# Patient Record
Sex: Female | Born: 1955 | Race: White | Hispanic: No | Smoking: Former smoker
Health system: Southern US, Community
[De-identification: ages and names within clinical notes are randomized; demographics above are authoritative.]

## PROBLEM LIST (undated history)

## (undated) DIAGNOSIS — H269 Unspecified cataract: Secondary | ICD-10-CM

## (undated) DIAGNOSIS — H409 Unspecified glaucoma: Secondary | ICD-10-CM

## (undated) DIAGNOSIS — G4730 Sleep apnea, unspecified: Secondary | ICD-10-CM

## (undated) DIAGNOSIS — J449 Chronic obstructive pulmonary disease, unspecified: Secondary | ICD-10-CM

## (undated) DIAGNOSIS — J45909 Unspecified asthma, uncomplicated: Secondary | ICD-10-CM

## (undated) DIAGNOSIS — F4310 Post-traumatic stress disorder, unspecified: Secondary | ICD-10-CM

## (undated) DIAGNOSIS — K3184 Gastroparesis: Secondary | ICD-10-CM

## (undated) DIAGNOSIS — F329 Major depressive disorder, single episode, unspecified: Secondary | ICD-10-CM

## (undated) DIAGNOSIS — F32A Depression, unspecified: Secondary | ICD-10-CM

## (undated) DIAGNOSIS — F319 Bipolar disorder, unspecified: Secondary | ICD-10-CM

## (undated) DIAGNOSIS — I700 Atherosclerosis of aorta: Secondary | ICD-10-CM

## (undated) DIAGNOSIS — E559 Vitamin D deficiency, unspecified: Secondary | ICD-10-CM

## (undated) DIAGNOSIS — E785 Hyperlipidemia, unspecified: Secondary | ICD-10-CM

## (undated) DIAGNOSIS — M810 Age-related osteoporosis without current pathological fracture: Secondary | ICD-10-CM

## (undated) DIAGNOSIS — K581 Irritable bowel syndrome with constipation: Secondary | ICD-10-CM

## (undated) DIAGNOSIS — F419 Anxiety disorder, unspecified: Secondary | ICD-10-CM

## (undated) DIAGNOSIS — D649 Anemia, unspecified: Secondary | ICD-10-CM

## (undated) DIAGNOSIS — J439 Emphysema, unspecified: Secondary | ICD-10-CM

## (undated) DIAGNOSIS — K2970 Gastritis, unspecified, without bleeding: Secondary | ICD-10-CM

## (undated) HISTORY — DX: Gastroparesis: K31.84

## (undated) HISTORY — DX: Chronic obstructive pulmonary disease, unspecified: J44.9

## (undated) HISTORY — DX: Sleep apnea, unspecified: G47.30

## (undated) HISTORY — DX: Unspecified asthma, uncomplicated: J45.909

## (undated) HISTORY — DX: Anxiety disorder, unspecified: F41.9

## (undated) HISTORY — DX: Unspecified cataract: H26.9

## (undated) HISTORY — DX: Bipolar disorder, unspecified: F31.9

## (undated) HISTORY — DX: Irritable bowel syndrome with constipation: K58.1

## (undated) HISTORY — DX: Gastritis, unspecified, without bleeding: K29.70

## (undated) HISTORY — DX: Unspecified glaucoma: H40.9

## (undated) HISTORY — PX: EXCISION OF ABDOMINAL WALL TUMOR: SHX6687

## (undated) HISTORY — DX: Emphysema, unspecified: J43.9

## (undated) HISTORY — DX: Hyperlipidemia, unspecified: E78.5

## (undated) HISTORY — DX: Vitamin D deficiency, unspecified: E55.9

## (undated) HISTORY — DX: Post-traumatic stress disorder, unspecified: F43.10

## (undated) HISTORY — DX: Atherosclerosis of aorta: I70.0

## (undated) HISTORY — DX: Age-related osteoporosis without current pathological fracture: M81.0

## (undated) HISTORY — DX: Depression, unspecified: F32.A

## (undated) HISTORY — DX: Anemia, unspecified: D64.9

---
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
## 2007-06-08 ENCOUNTER — Encounter: Admission: RE | Admit: 2007-06-08 | Discharge: 2007-06-08 | Payer: Self-pay | Admitting: Obstetrics & Gynecology

## 2010-08-27 DIAGNOSIS — R519 Headache, unspecified: Secondary | ICD-10-CM | POA: Insufficient documentation

## 2011-11-09 DIAGNOSIS — M79609 Pain in unspecified limb: Secondary | ICD-10-CM | POA: Insufficient documentation

## 2012-02-25 DIAGNOSIS — J208 Acute bronchitis due to other specified organisms: Secondary | ICD-10-CM | POA: Insufficient documentation

## 2012-08-31 DIAGNOSIS — R3915 Urgency of urination: Secondary | ICD-10-CM | POA: Insufficient documentation

## 2012-12-05 DIAGNOSIS — R002 Palpitations: Secondary | ICD-10-CM | POA: Insufficient documentation

## 2012-12-28 DIAGNOSIS — J029 Acute pharyngitis, unspecified: Secondary | ICD-10-CM | POA: Insufficient documentation

## 2014-07-02 DIAGNOSIS — R42 Dizziness and giddiness: Secondary | ICD-10-CM | POA: Insufficient documentation

## 2014-10-14 DIAGNOSIS — J0191 Acute recurrent sinusitis, unspecified: Secondary | ICD-10-CM | POA: Insufficient documentation

## 2015-04-04 DIAGNOSIS — L299 Pruritus, unspecified: Secondary | ICD-10-CM | POA: Insufficient documentation

## 2015-04-04 DIAGNOSIS — J4520 Mild intermittent asthma, uncomplicated: Secondary | ICD-10-CM | POA: Insufficient documentation

## 2015-04-08 DIAGNOSIS — R21 Rash and other nonspecific skin eruption: Secondary | ICD-10-CM | POA: Insufficient documentation

## 2015-07-24 ENCOUNTER — Ambulatory Visit (INDEPENDENT_AMBULATORY_CARE_PROVIDER_SITE_OTHER): Payer: Medicare Other

## 2015-07-24 DIAGNOSIS — J309 Allergic rhinitis, unspecified: Secondary | ICD-10-CM

## 2015-08-04 ENCOUNTER — Ambulatory Visit (INDEPENDENT_AMBULATORY_CARE_PROVIDER_SITE_OTHER): Payer: Medicare Other | Admitting: *Deleted

## 2015-08-04 DIAGNOSIS — J309 Allergic rhinitis, unspecified: Secondary | ICD-10-CM

## 2015-08-21 ENCOUNTER — Ambulatory Visit (INDEPENDENT_AMBULATORY_CARE_PROVIDER_SITE_OTHER): Payer: Medicare Other

## 2015-08-21 DIAGNOSIS — J309 Allergic rhinitis, unspecified: Secondary | ICD-10-CM

## 2015-08-27 DIAGNOSIS — J301 Allergic rhinitis due to pollen: Secondary | ICD-10-CM | POA: Diagnosis not present

## 2015-08-28 DIAGNOSIS — J3089 Other allergic rhinitis: Secondary | ICD-10-CM | POA: Diagnosis not present

## 2015-09-01 ENCOUNTER — Ambulatory Visit (INDEPENDENT_AMBULATORY_CARE_PROVIDER_SITE_OTHER): Payer: Medicare Other | Admitting: *Deleted

## 2015-09-01 DIAGNOSIS — J309 Allergic rhinitis, unspecified: Secondary | ICD-10-CM | POA: Diagnosis not present

## 2015-09-09 DIAGNOSIS — J4530 Mild persistent asthma, uncomplicated: Secondary | ICD-10-CM | POA: Insufficient documentation

## 2015-09-09 DIAGNOSIS — H9203 Otalgia, bilateral: Secondary | ICD-10-CM | POA: Insufficient documentation

## 2015-09-15 ENCOUNTER — Ambulatory Visit (INDEPENDENT_AMBULATORY_CARE_PROVIDER_SITE_OTHER): Payer: Medicare Other | Admitting: *Deleted

## 2015-09-15 DIAGNOSIS — J309 Allergic rhinitis, unspecified: Secondary | ICD-10-CM

## 2015-10-23 ENCOUNTER — Ambulatory Visit (INDEPENDENT_AMBULATORY_CARE_PROVIDER_SITE_OTHER): Payer: Medicare Other | Admitting: *Deleted

## 2015-10-23 DIAGNOSIS — J309 Allergic rhinitis, unspecified: Secondary | ICD-10-CM

## 2015-11-03 ENCOUNTER — Ambulatory Visit (INDEPENDENT_AMBULATORY_CARE_PROVIDER_SITE_OTHER): Payer: Medicare Other | Admitting: *Deleted

## 2015-11-03 DIAGNOSIS — J309 Allergic rhinitis, unspecified: Secondary | ICD-10-CM

## 2015-11-17 ENCOUNTER — Ambulatory Visit (INDEPENDENT_AMBULATORY_CARE_PROVIDER_SITE_OTHER): Payer: Medicare Other | Admitting: *Deleted

## 2015-11-17 DIAGNOSIS — J309 Allergic rhinitis, unspecified: Secondary | ICD-10-CM | POA: Diagnosis not present

## 2015-11-27 DIAGNOSIS — F329 Major depressive disorder, single episode, unspecified: Secondary | ICD-10-CM | POA: Insufficient documentation

## 2015-12-01 ENCOUNTER — Ambulatory Visit (INDEPENDENT_AMBULATORY_CARE_PROVIDER_SITE_OTHER): Payer: Medicare Other

## 2015-12-01 DIAGNOSIS — J309 Allergic rhinitis, unspecified: Secondary | ICD-10-CM

## 2015-12-15 ENCOUNTER — Ambulatory Visit (INDEPENDENT_AMBULATORY_CARE_PROVIDER_SITE_OTHER): Payer: Medicare Other | Admitting: *Deleted

## 2015-12-15 DIAGNOSIS — J309 Allergic rhinitis, unspecified: Secondary | ICD-10-CM | POA: Diagnosis not present

## 2015-12-29 ENCOUNTER — Ambulatory Visit (INDEPENDENT_AMBULATORY_CARE_PROVIDER_SITE_OTHER): Payer: Medicare Other | Admitting: *Deleted

## 2015-12-29 DIAGNOSIS — J309 Allergic rhinitis, unspecified: Secondary | ICD-10-CM

## 2016-01-15 ENCOUNTER — Ambulatory Visit (INDEPENDENT_AMBULATORY_CARE_PROVIDER_SITE_OTHER): Payer: Medicare Other | Admitting: *Deleted

## 2016-01-15 DIAGNOSIS — J309 Allergic rhinitis, unspecified: Secondary | ICD-10-CM

## 2016-02-09 ENCOUNTER — Ambulatory Visit (INDEPENDENT_AMBULATORY_CARE_PROVIDER_SITE_OTHER): Payer: Medicare Other

## 2016-02-09 DIAGNOSIS — J309 Allergic rhinitis, unspecified: Secondary | ICD-10-CM | POA: Diagnosis not present

## 2016-03-01 ENCOUNTER — Ambulatory Visit (INDEPENDENT_AMBULATORY_CARE_PROVIDER_SITE_OTHER): Payer: Medicare Other

## 2016-03-01 DIAGNOSIS — J309 Allergic rhinitis, unspecified: Secondary | ICD-10-CM | POA: Diagnosis not present

## 2016-03-22 ENCOUNTER — Ambulatory Visit (INDEPENDENT_AMBULATORY_CARE_PROVIDER_SITE_OTHER): Payer: Medicare Other | Admitting: *Deleted

## 2016-03-22 DIAGNOSIS — J309 Allergic rhinitis, unspecified: Secondary | ICD-10-CM

## 2016-03-23 DIAGNOSIS — J301 Allergic rhinitis due to pollen: Secondary | ICD-10-CM | POA: Diagnosis not present

## 2016-03-24 DIAGNOSIS — J3089 Other allergic rhinitis: Secondary | ICD-10-CM | POA: Diagnosis not present

## 2016-04-15 ENCOUNTER — Ambulatory Visit (INDEPENDENT_AMBULATORY_CARE_PROVIDER_SITE_OTHER): Payer: Medicare Other | Admitting: *Deleted

## 2016-04-15 DIAGNOSIS — J309 Allergic rhinitis, unspecified: Secondary | ICD-10-CM | POA: Diagnosis not present

## 2016-05-03 ENCOUNTER — Ambulatory Visit (INDEPENDENT_AMBULATORY_CARE_PROVIDER_SITE_OTHER): Payer: Medicare Other

## 2016-05-03 DIAGNOSIS — J309 Allergic rhinitis, unspecified: Secondary | ICD-10-CM

## 2016-05-24 ENCOUNTER — Ambulatory Visit (INDEPENDENT_AMBULATORY_CARE_PROVIDER_SITE_OTHER): Payer: Medicare Other | Admitting: *Deleted

## 2016-05-24 DIAGNOSIS — J309 Allergic rhinitis, unspecified: Secondary | ICD-10-CM | POA: Diagnosis not present

## 2016-06-17 ENCOUNTER — Ambulatory Visit (INDEPENDENT_AMBULATORY_CARE_PROVIDER_SITE_OTHER): Payer: Medicare Other | Admitting: *Deleted

## 2016-06-17 DIAGNOSIS — J309 Allergic rhinitis, unspecified: Secondary | ICD-10-CM

## 2016-07-05 ENCOUNTER — Ambulatory Visit (INDEPENDENT_AMBULATORY_CARE_PROVIDER_SITE_OTHER): Payer: Medicare Other | Admitting: *Deleted

## 2016-07-05 DIAGNOSIS — J309 Allergic rhinitis, unspecified: Secondary | ICD-10-CM | POA: Diagnosis not present

## 2016-07-22 DIAGNOSIS — R55 Syncope and collapse: Secondary | ICD-10-CM | POA: Insufficient documentation

## 2016-08-02 ENCOUNTER — Ambulatory Visit (INDEPENDENT_AMBULATORY_CARE_PROVIDER_SITE_OTHER): Payer: Medicare Other | Admitting: *Deleted

## 2016-08-02 DIAGNOSIS — J309 Allergic rhinitis, unspecified: Secondary | ICD-10-CM

## 2016-08-16 ENCOUNTER — Ambulatory Visit (INDEPENDENT_AMBULATORY_CARE_PROVIDER_SITE_OTHER): Payer: Medicare Other

## 2016-08-16 DIAGNOSIS — J309 Allergic rhinitis, unspecified: Secondary | ICD-10-CM | POA: Diagnosis not present

## 2016-08-23 ENCOUNTER — Ambulatory Visit: Payer: Medicare Other | Admitting: Allergy and Immunology

## 2016-08-30 ENCOUNTER — Ambulatory Visit (INDEPENDENT_AMBULATORY_CARE_PROVIDER_SITE_OTHER): Payer: Medicare Other | Admitting: *Deleted

## 2016-08-30 DIAGNOSIS — J309 Allergic rhinitis, unspecified: Secondary | ICD-10-CM

## 2016-09-20 ENCOUNTER — Ambulatory Visit (INDEPENDENT_AMBULATORY_CARE_PROVIDER_SITE_OTHER): Payer: Medicare Other | Admitting: *Deleted

## 2016-09-20 DIAGNOSIS — J309 Allergic rhinitis, unspecified: Secondary | ICD-10-CM | POA: Diagnosis not present

## 2016-10-19 DIAGNOSIS — G629 Polyneuropathy, unspecified: Secondary | ICD-10-CM | POA: Insufficient documentation

## 2016-10-26 NOTE — Addendum Note (Signed)
Addended by: Berna BueWHITAKER, CARRIE L on: 10/26/2016 09:33 AM   Modules accepted: Orders

## 2016-11-15 ENCOUNTER — Ambulatory Visit: Payer: Medicare Other | Admitting: Allergy and Immunology

## 2016-11-22 ENCOUNTER — Ambulatory Visit: Payer: Medicare Other | Admitting: Allergy and Immunology

## 2017-04-22 DIAGNOSIS — M791 Myalgia, unspecified site: Secondary | ICD-10-CM | POA: Insufficient documentation

## 2017-07-12 ENCOUNTER — Ambulatory Visit (INDEPENDENT_AMBULATORY_CARE_PROVIDER_SITE_OTHER): Payer: Medicare Other

## 2017-07-12 ENCOUNTER — Ambulatory Visit (INDEPENDENT_AMBULATORY_CARE_PROVIDER_SITE_OTHER): Payer: Medicare Other | Admitting: Orthopaedic Surgery

## 2017-07-12 ENCOUNTER — Encounter (INDEPENDENT_AMBULATORY_CARE_PROVIDER_SITE_OTHER): Payer: Self-pay | Admitting: Orthopaedic Surgery

## 2017-07-12 VITALS — BP 93/68 | HR 57 | Temp 97.5°F | Ht 65.0 in | Wt 145.0 lb

## 2017-07-12 DIAGNOSIS — M7501 Adhesive capsulitis of right shoulder: Secondary | ICD-10-CM

## 2017-07-12 DIAGNOSIS — M47812 Spondylosis without myelopathy or radiculopathy, cervical region: Secondary | ICD-10-CM

## 2017-07-12 DIAGNOSIS — M25511 Pain in right shoulder: Secondary | ICD-10-CM

## 2017-07-12 DIAGNOSIS — M542 Cervicalgia: Secondary | ICD-10-CM

## 2017-07-12 MED ORDER — PREDNISONE 5 MG PO TABS: 5.0000 mg | ORAL_TABLET | Freq: Every day | ORAL | 0 refills | Status: DC

## 2017-07-12 MED ORDER — LIDOCAINE HCL 1 % IJ SOLN
1.0000 mL | INTRAMUSCULAR | Status: AC | PRN
  Administered 2017-07-12: 1 mL

## 2017-07-12 MED ORDER — METHYLPREDNISOLONE ACETATE 40 MG/ML IJ SUSP
40.0000 mg | INTRAMUSCULAR | Status: AC | PRN
  Administered 2017-07-12: 40 mg via INTRA_ARTICULAR

## 2017-07-12 MED ORDER — BUPIVACAINE HCL 0.25 % IJ SOLN
4.0000 mL | INTRAMUSCULAR | Status: AC | PRN
  Administered 2017-07-12: 4 mL via INTRA_ARTICULAR

## 2017-07-12 NOTE — Addendum Note (Signed)
Addended by: Rogers Seeds on: 07/12/2017 01:42 PM   Modules accepted: Orders

## 2017-07-12 NOTE — Progress Notes (Signed)
Office Visit Note   Patient: Leslie Schwartz           Date of Birth: 03/22/1956           MRN: [id number] Visit Date: 07/12/2017              Requested by: No referring provider defined for this encounter. PCP: Dema Severin, NP   Assessment & Plan: Visit Diagnoses:  1. Neck pain   2. Right shoulder pain, unspecified chronicity   3. Spondylosis without myelopathy or radiculopathy, cervical region   4. Adhesive capsulitis of right shoulder     Plan: Subacromial injection performed. We'll set up for physical therapy and aspirin where she lives for shoulder range of motion exercises she can get a pulley that she can use on her own and I discussed with her appropriate use. We'll recheck her in 3 weeks  Follow-Up Instructions: Return in about 4 weeks (around 08/09/2017).   Orders:  Orders Placed This Encounter  Procedures  . XR Cervical Spine 2 or 3 views  . XR Shoulder Right   Meds ordered this encounter  Medications  . predniSONE (DELTASONE) 5 MG tablet    Sig: Take 1 tablet (5 mg total) by mouth daily with breakfast.    Dispense:  21 tablet    Refill:  0      Procedures: Large Joint Inj Date/Time: 07/12/2017 10:08 AM Performed by: Eldred Manges Authorized by: Annell Greening C   Consent Given by:  Patient Indications:  Pain Location:  Shoulder Site:  R subacromial bursa Needle Size:  22 G Needle Length:  1.5 inches Ultrasound Guidance: No   Fluoroscopic Guidance: No   Arthrogram: No   Medications:  1 mL lidocaine 1 %; 40 mg methylPREDNISolone acetate 40 MG/ML; 4 mL bupivacaine 0.25 % Patient tolerance:  Patient tolerated the procedure well with no immediate complications     Clinical Data: No additional findings.   Subjective: Chief Complaint  Patient presents with  . Right Shoulder - Pain  . Right Arm - Pain  . Neck - Pain    HPI 61 year old female with the right arm pain this and present for about 2 months she's had decreased range of motion of Quilty  lifting pain that radiates down over the inner portion of her upper arm stops at the elbow and some posteriorly over the triceps. She's used ice heating pad and ibuprofen without relief. She takes medications for seizures as well as Celexa. No history of diabetes. She had no specific injury. Tried to do some yoga but this has not improved her pain and in some ways made it worse.  Review of Systems positive for asthma, osteopenia vitamin D3 deficiency, seizure disorder one half pack per day smoker. Otherwise negative as it pertains history of present illness.   Objective: Vital Signs: BP 93/68   Pulse (!) 57   Temp (!) 97.5 F (36.4 C)   Ht  (1.651 m)   Wt 145 lb (65.8 kg)   BMI 24.13 kg/m   Physical Exam  Constitutional: She is oriented to person, place, and time. She appears well-developed.  HENT:  Head: Normocephalic.  Right Ear: External ear normal.  Left Ear: External ear normal.  Eyes: Pupils are equal, round, and reactive to light.  Neck: No tracheal deviation present. No thyromegaly present.  Cardiovascular: Normal rate.   Pulmonary/Chest: Effort normal.  Abdominal: Soft.  Musculoskeletal:  Patient has active abduction only to 80 right arm. Passively  I can get her to about 1:30 with pain but no significant resistance to range of motion only pain. She can reach the posterior axillary line. She can reach across the opposite shoulder with elbow flexed. Mildly positive impingement negative drop arm test. Lung of the biceps nontender no shoulder subluxation. She has brachial plexus tenderness worse on the right than left. Trapezial tenderness. Upper extremity reflexes are 2+ and symmetrical sensation the hand is normal. No wrist flexion extension interossei weakness. Compression ulnar nerve cubital tunnel median nerve the forearm and wrist is normal. No thenar atrophy.  Neurological: She is alert and oriented to person, place, and time.  Skin: Skin is warm and dry.  Psychiatric:  She has a normal mood and affect. Her behavior is normal.    Ortho Exam  Specialty Comments:  No specialty comments available.  Imaging: No results found.   PMFS History: There are no active problems to display for this patient.  Past Medical History:  Diagnosis Date  . Anxiety   . Asthma   . Sleep apnea   . Sleep apnea     History reviewed. No pertinent family history.  History reviewed. No pertinent surgical history. Social History   Occupational History  . Not on file.   Social History Main Topics  . Smoking status: Current Some Day Smoker    Types: Cigarettes  . Smokeless tobacco: Current User  . Alcohol use Not on file  . Drug use: Unknown  . Sexual activity: Not on file

## 2017-08-02 ENCOUNTER — Ambulatory Visit (INDEPENDENT_AMBULATORY_CARE_PROVIDER_SITE_OTHER): Payer: Medicare Other | Admitting: Orthopaedic Surgery

## 2017-08-02 ENCOUNTER — Encounter (INDEPENDENT_AMBULATORY_CARE_PROVIDER_SITE_OTHER): Payer: Self-pay | Admitting: Orthopaedic Surgery

## 2017-08-02 VITALS — BP 109/76 | HR 63

## 2017-08-02 DIAGNOSIS — M7501 Adhesive capsulitis of right shoulder: Secondary | ICD-10-CM | POA: Diagnosis not present

## 2017-08-02 NOTE — Progress Notes (Signed)
   Office Visit Note   Patient: Leslie Schwartz           Date of Birth: 10/19/1955           MRN: [id number] Visit Date: 08/02/2017              Requested by: Dema SeverinYork, Regina F, NP [address] [place], [place] [id number] PCP: Dema SeverinYork, Regina F, NP   Assessment & Plan: Visit Diagnoses:  1. Adhesive capsulitis of right shoulder     Plan: Patient is making progress with range of motion of her shoulder on her own we gave her additional exercises with walking with her fingers, use of a pulley, using a broom stick in the supine position. Long as she continues to improve range of motion and decrease pain we can defer any imaging studies. She'll call if she has increased problems.  Follow-Up Instructions: Return if symptoms worsen or fail to improve.   Orders:  No orders of the defined types were placed in this encounter.  No orders of the defined types were placed in this encounter.     Procedures: No procedures performed   Clinical Data: No additional findings.   Subjective: Chief Complaint  Patient presents with  . Neck - Follow-up  . Right Shoulder - Follow-up    HPI 61 year old female returns post shoulder injection 07-28-2017. Physical therapy was ordered but she states she did not go to therapy has been working on her own. She states her range of motion is improved pain is decreased.  Review of Systems 14 pt  systems updated unchanged from 07/12/2017 office visit. Patient has some mild spondylosis on cervical spine x-rays, osteopenia with vitamin D deficiency, seizure disorder, one half pack per day smoker.   Objective: Vital Signs: BP 109/76   Pulse 63   Physical Exam  Constitutional: She is oriented to person, place, and time. She appears well-developed.  HENT:  Head: Normocephalic.  Right Ear: External ear normal.  Left Ear: External ear normal.  Eyes: Pupils are equal, round, and reactive to light.  Neck: No tracheal deviation present. No thyromegaly present.    Cardiovascular: Normal rate.   Pulmonary/Chest: Effort normal.  Abdominal: Soft.  Neurological: She is alert and oriented to person, place, and time.  Skin: Skin is warm and dry.  Psychiatric: She has a normal mood and affect. Her behavior is normal.    Ortho Exam patient can abduct to 120. Passively she lacks 20 full abduction. She can reach us past posterior axillary line. Mildly positive impingement test. Good cervical range of motion reflexes are 2+ and symmetrical.  Specialty Comments:  No specialty comments available.  Imaging: No results found.   PMFS History: There are no active problems to display for this patient.  Past Medical History:  Diagnosis Date  . Anxiety   . Asthma   . Sleep apnea   . Sleep apnea     No family history on file.  No past surgical history on file. Social History   Occupational History  . Not on file.   Social History Main Topics  . Smoking status: Current Some Day Smoker    Types: Cigarettes  . Smokeless tobacco: Current User  . Alcohol use Not on file  . Drug use: Unknown  . Sexual activity: Not on file

## 2018-03-14 ENCOUNTER — Ambulatory Visit (INDEPENDENT_AMBULATORY_CARE_PROVIDER_SITE_OTHER): Payer: Medicare Other

## 2018-03-14 ENCOUNTER — Encounter (INDEPENDENT_AMBULATORY_CARE_PROVIDER_SITE_OTHER): Payer: Self-pay | Admitting: Orthopaedic Surgery

## 2018-03-14 ENCOUNTER — Ambulatory Visit (INDEPENDENT_AMBULATORY_CARE_PROVIDER_SITE_OTHER): Payer: Medicare Other | Admitting: Orthopaedic Surgery

## 2018-03-14 VITALS — BP 123/76 | HR 63 | Ht 65.0 in | Wt 142.0 lb

## 2018-03-14 DIAGNOSIS — M25512 Pain in left shoulder: Secondary | ICD-10-CM

## 2018-03-14 DIAGNOSIS — M25511 Pain in right shoulder: Secondary | ICD-10-CM

## 2018-03-14 DIAGNOSIS — G8929 Other chronic pain: Secondary | ICD-10-CM | POA: Diagnosis not present

## 2018-03-14 DIAGNOSIS — M542 Cervicalgia: Secondary | ICD-10-CM

## 2018-03-14 MED ORDER — METHYLPREDNISOLONE ACETATE 40 MG/ML IJ SUSP
40.0000 mg | INTRAMUSCULAR | Status: AC | PRN
  Administered 2018-03-14: 40 mg via INTRA_ARTICULAR

## 2018-03-14 MED ORDER — LIDOCAINE HCL 1 % IJ SOLN
0.5000 mL | INTRAMUSCULAR | Status: AC | PRN
  Administered 2018-03-14: .5 mL

## 2018-03-14 MED ORDER — BUPIVACAINE HCL 0.25 % IJ SOLN
4.0000 mL | INTRAMUSCULAR | Status: AC | PRN
  Administered 2018-03-14: 4 mL via INTRA_ARTICULAR
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
## 2018-05-30 ENCOUNTER — Ambulatory Visit (INDEPENDENT_AMBULATORY_CARE_PROVIDER_SITE_OTHER): Payer: Medicare Other | Admitting: Orthopaedic Surgery

## 2018-05-30 ENCOUNTER — Encounter (INDEPENDENT_AMBULATORY_CARE_PROVIDER_SITE_OTHER): Payer: Self-pay | Admitting: Orthopaedic Surgery

## 2018-05-30 VITALS — BP 101/69 | HR 63 | Ht 65.0 in | Wt 138.5 lb

## 2018-05-30 DIAGNOSIS — M7502 Adhesive capsulitis of left shoulder: Secondary | ICD-10-CM | POA: Insufficient documentation

## 2018-05-30 DIAGNOSIS — M7541 Impingement syndrome of right shoulder: Secondary | ICD-10-CM

## 2018-05-30 HISTORY — DX: Adhesive capsulitis of left shoulder: M75.02

## 2018-05-30 HISTORY — DX: Impingement syndrome of right shoulder: M75.41

## 2018-05-30 MED ORDER — METHYLPREDNISOLONE ACETATE 40 MG/ML IJ SUSP
40.0000 mg | INTRAMUSCULAR | Status: AC | PRN
  Administered 2018-05-30: 40 mg via INTRA_ARTICULAR

## 2018-05-30 MED ORDER — LIDOCAINE HCL 1 % IJ SOLN
0.5000 mL | INTRAMUSCULAR | Status: AC | PRN
  Administered 2018-05-30: .5 mL

## 2018-05-30 MED ORDER — BUPIVACAINE HCL 0.25 % IJ SOLN
4.0000 mL | INTRAMUSCULAR | Status: AC | PRN
  Administered 2018-05-30: 4 mL via INTRA_ARTICULAR
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
## 2018-08-24 DIAGNOSIS — M545 Low back pain, unspecified: Secondary | ICD-10-CM | POA: Insufficient documentation

## 2019-01-15 DIAGNOSIS — E0590 Thyrotoxicosis, unspecified without thyrotoxic crisis or storm: Secondary | ICD-10-CM | POA: Insufficient documentation

## 2019-03-27 DIAGNOSIS — R509 Fever, unspecified: Secondary | ICD-10-CM | POA: Insufficient documentation

## 2019-04-24 DIAGNOSIS — R630 Anorexia: Secondary | ICD-10-CM | POA: Insufficient documentation

## 2019-05-17 DIAGNOSIS — R569 Unspecified convulsions: Secondary | ICD-10-CM | POA: Insufficient documentation

## 2019-06-14 DIAGNOSIS — F331 Major depressive disorder, recurrent, moderate: Secondary | ICD-10-CM | POA: Insufficient documentation

## 2019-06-26 DIAGNOSIS — W01198A Fall on same level from slipping, tripping and stumbling with subsequent striking against other object, initial encounter: Secondary | ICD-10-CM | POA: Insufficient documentation

## 2019-07-16 DIAGNOSIS — Z131 Encounter for screening for diabetes mellitus: Secondary | ICD-10-CM | POA: Insufficient documentation

## 2019-09-12 ENCOUNTER — Ambulatory Visit (INDEPENDENT_AMBULATORY_CARE_PROVIDER_SITE_OTHER): Payer: Medicare Other | Admitting: Cardiology

## 2019-09-12 ENCOUNTER — Other Ambulatory Visit: Payer: Self-pay

## 2019-09-12 ENCOUNTER — Encounter: Payer: Self-pay | Admitting: Cardiology

## 2019-09-12 ENCOUNTER — Encounter: Payer: Self-pay | Admitting: *Deleted

## 2019-09-12 DIAGNOSIS — R079 Chest pain, unspecified: Secondary | ICD-10-CM | POA: Diagnosis not present

## 2019-09-12 DIAGNOSIS — R0602 Shortness of breath: Secondary | ICD-10-CM | POA: Diagnosis not present

## 2019-09-12 MED ORDER — CELECOXIB 100 MG PO CAPS: 100.0000 mg | ORAL_CAPSULE | Freq: Two times a day (BID) | ORAL | 3 refills | Status: DC

## 2019-09-12 NOTE — Patient Instructions (Addendum)
Medication Instructions:  Your physician has recommended you make the following change in your medication:   START celecoxib (celebrex) 100 mg: Take 1 capsule twice daily  *If you need a refill on your cardiac medications before your next appointment, please call your pharmacy*  Lab Work: None  If you have labs (blood work) drawn today and your tests are completely normal, you will receive your results only by: Marland Kitchen MyChart Message (if you have MyChart) OR . A paper copy in the mail If you have any lab test that is abnormal or we need to change your treatment, we will call you to review the results.  Testing/Procedures: You had an EKG today.   Your physician has requested that you have a lexiscan myoview. For further information please visit [email or web address]. Please follow instruction sheet, as given.  Follow-Up: At [facility], you and your health needs are our priority.  As part of our continuing mission to provide you with exceptional heart care, we have created designated Provider Care Teams.  These Care Teams include your primary Cardiologist (physician) and Advanced Practice Providers (APPs -  Physician Assistants and Nurse Practitioners) who all work together to provide you with the care you need, when you need it.  Your next appointment:   6 week(s)  The format for your next appointment:   In Person  Provider:   Shirlee More, MD     Costochondritis Costochondritis is swelling and irritation (inflammation) of the tissue (cartilage) that connects your ribs to your breastbone (sternum). This causes pain in the front of your chest. Usually, the pain:  Starts gradually.  Is in more than one rib. This condition usually goes away on its own over time. Follow these instructions at home:  Do not do anything that makes your pain worse.  If directed, put ice on the painful area: ? Put ice in a plastic bag. ? Place a towel between your skin and the bag. ? Leave the ice  on for 20 minutes, 2-3 times a day.  If directed, put heat on the affected area as often as told by your doctor. Use the heat source that your doctor tells you to use, such as a moist heat pack or a heating pad. ? Place a towel between your skin and the heat source. ? Leave the heat on for 20-30 minutes. ? Take off the heat if your skin turns bright red. This is very important if you cannot feel pain, heat, or cold. You may have a greater risk of getting burned.  Take over-the-counter and prescription medicines only as told by your doctor.  Return to your normal activities as told by your doctor. Ask your doctor what activities are safe for you.  Keep all follow-up visits as told by your doctor. This is important. Contact a doctor if:  You have chills or a fever.  Your pain does not go away or it gets worse.  You have a cough that does not go away. Get help right away if:  You are short of breath. This information is not intended to replace advice given to you by your health care provider. Make sure you discuss any questions you have with your health care provider. Document Released: 03/15/2008 Document Revised: 10/12/2017 Document Reviewed: 01/21/2016 Elsevier Patient Education  2020 [place].    Cardiac Nuclear Scan A cardiac nuclear scan is a test that is done to check the flow of blood to your heart. It is done when you are resting  and when you are exercising. The test looks for problems such as:  Not enough blood reaching a portion of the heart.  The heart muscle not working as it should. You may need this test if:  You have heart disease.  You have had lab results that are not normal.  You have had heart surgery or a balloon procedure to open up blocked arteries (angioplasty).  You have chest pain.  You have shortness of breath. In this test, a special dye (tracer) is put into your bloodstream. The tracer will travel to your heart. A camera will then take  pictures of your heart to see how the tracer moves through your heart. This test is usually done at a hospital and takes 2-4 hours. Tell a doctor about:  Any allergies you have.  All medicines you are taking, including vitamins, herbs, eye drops, creams, and over-the-counter medicines.  Any problems you or family members have had with anesthetic medicines.  Any blood disorders you have.  Any surgeries you have had.  Any medical conditions you have.  Whether you are pregnant or may be pregnant. What are the risks? Generally, this is a safe test. However, problems may occur, such as:  Serious chest pain and heart attack. This is only a risk if the stress portion of the test is done.  Rapid heartbeat.  A feeling of warmth in your chest. This feeling usually does not last long.  Allergic reaction to the tracer. What happens before the test?  Ask your doctor about changing or stopping your normal medicines. This is important.  Follow instructions from your doctor about what you cannot eat or drink.  Remove your jewelry on the day of the test. What happens during the test?  An IV tube will be inserted into one of your veins.  Your doctor will give you a small amount of tracer through the IV tube.  You will wait for 20-40 minutes while the tracer moves through your bloodstream.  Your heart will be monitored with an electrocardiogram (ECG).  You will lie down on an exam table.  Pictures of your heart will be taken for about 15-20 minutes.  You may also have a stress test. For this test, one of these things may be done: ? You will be asked to exercise on a treadmill or a stationary bike. ? You will be given medicines that will make your heart work harder. This is done if you are unable to exercise.  When blood flow to your heart has peaked, a tracer will again be given through the IV tube.  After 20-40 minutes, you will get back on the exam table. More pictures will be  taken of your heart.  Depending on the tracer that is used, more pictures may need to be taken 3-4 hours later.  Your IV tube will be removed when the test is over. The test may vary among doctors and hospitals. What happens after the test?  Ask your doctor: ? Whether you can return to your normal schedule, including diet, activities, and medicines. ? Whether you should drink more fluids. This will help to remove the tracer from your body. Drink enough fluid to keep your pee (urine) pale yellow.  Ask your doctor, or the department that is doing the test: ? When will my results be ready? ? How will I get my results? Summary  A cardiac nuclear scan is a test that is done to check the flow of blood to your  heart.  Tell your doctor whether you are pregnant or may be pregnant.  Before the test, ask your doctor about changing or stopping your normal medicines. This is important.  Ask your doctor whether you can return to your normal activities. You may be asked to drink more fluids. This information is not intended to replace advice given to you by your health care provider. Make sure you discuss any questions you have with your health care provider. Document Released: 03/13/2018 Document Revised: 01/17/2019 Document Reviewed: 03/13/2018 Elsevier Patient Education  2020 [employer] Inc.   Celecoxib capsules What is this medicine? CELECOXIB (sell a KOX ib) is a non-steroidal anti-inflammatory drug (NSAID). This medicine is used to treat arthritis and ankylosing spondylitis. It may be also used for pain or painful monthly periods. This medicine may be used for other purposes; ask your health care provider or pharmacist if you have questions. COMMON BRAND NAME(S): Celebrex What should I tell my health care provider before I take this medicine? They need to know if you have any of these conditions:  asthma  coronary artery bypass graft (CABG) surgery within the past 2 weeks  drink more  than 3 alcohol-containing drinks a day  heart disease or circulation problems like heart failure or leg edema (fluid retention)  high blood pressure  kidney disease  liver disease  stomach bleeding or ulcers  an unusual or allergic reaction to celecoxib, sulfa drugs, aspirin, other NSAIDs, other medicines, foods, dyes, or preservatives  pregnant or trying to get pregnant  breast-feeding How should I use this medicine? Take this medicine by mouth with a full glass of water. Follow the directions on the prescription label. Take it with food if it upsets your stomach or if you take 400 mg at one time. Try to not lie down for at least 10 minutes after you take the medicine. Take the medicine at the same time each day. Do not take more medicine than you are told to take. Long-term, continuous use may increase the risk of heart attack or stroke. A special MedGuide will be given to you by the pharmacist with each prescription and refill. Be sure to read this information carefully each time. Talk to your pediatrician regarding the use of this medicine in children. Special care may be needed. Overdosage: If you think you have taken too much of this medicine contact a poison control center or emergency room at once. NOTE: This medicine is only for you. Do not share this medicine with others. What if I miss a dose? If you miss a dose, take it as soon as you can. If it is almost time for your next dose, take only that dose. Do not take double or extra doses. What may interact with this medicine? Do not take this medicine with any of the following medications:  cidofovir  methotrexate  other NSAIDs, medicines for pain and inflammation, like ibuprofen or naproxen  pemetrexed This medicine may also interact with the following medications:  alcohol  aspirin and aspirin-like drugs  diuretics  fluconazole  lithium  medicines for high blood pressure  steroid medicines like prednisone or  cortisone  warfarin This list may not describe all possible interactions. Give your health care provider a list of all the medicines, herbs, non-prescription drugs, or dietary supplements you use. Also tell them if you smoke, drink alcohol, or use illegal drugs. Some items may interact with your medicine. What should I watch for while using this medicine? Tell your doctor or  healthcare provider if your pain does not get better. Talk to your doctor before taking another medicine for pain. Do not treat yourself. This medicine may cause serious skin reactions. They can happen weeks to months after starting the medicine. Contact your healthcare provider right away if you notice fevers or flu-like symptoms with a rash. The rash may be red or purple and then turn into blisters or peeling of the skin. Or, you might notice a red rash with swelling of the face, lips or lymph nodes in your neck or under your arms. This medicine does not prevent heart attack or stroke. In fact, this medicine may increase the chance of a heart attack or stroke. The chance may increase with longer use of this medicine and in people who have heart disease. If you take aspirin to prevent heart attack or stroke, talk with your doctor or healthcare provider. Do not take medicines such as ibuprofen and naproxen with this medicine. Side effects such as stomach upset, nausea, or ulcers may be more likely to occur. Many medicines available without a prescription should not be taken with this medicine. This medicine can cause ulcers and bleeding in the stomach and intestines at any time during treatment. Ulcers and bleeding can happen without warning symptoms and can cause death. What side effects may I notice from receiving this medicine? Side effects that you should report to your doctor or health care professional as soon as possible:  allergic reactions like skin rash, itching or hives, swelling of the face, lips, or tongue  Zeimet or  bloody stools, blood in the urine or vomit  blurred vision  breathing problems  chest pain  nausea, vomiting  problems with balance, talking, walking  rash, fever, and swollen lymph nodes  redness, blistering, peeling or loosening of the skin, including inside the mouth  unexplained weight gain or swelling  unusually weak or tired  yellowing of eyes, skin Side effects that usually do not require medical attention (report to your doctor or health care professional if they continue or are bothersome):  constipation or diarrhea  dizziness  gas or heartburn  upset stomach This list may not describe all possible side effects. Call your doctor for medical advice about side effects. You may report side effects to FDA at 1-800-FDA-1088. Where should I keep my medicine? Keep out of the reach of children. Store at room temperature between 15 and 30 degrees C (59 and 86 degrees F). Keep container tightly closed. Throw away any unused medicine after the expiration date. NOTE: This sheet is a summary. It may not cover all possible information. If you have questions about this medicine, talk to your doctor, pharmacist, or health care provider.  2020 Elsevier/Gold Standard (2018-12-13 09:10:08)

## 2019-09-12 NOTE — Progress Notes (Signed)
Cardiology Office Note:    Date:  09/12/2019   ID:  Leslie BarrierDonna Schwartz, DOB 01/04/1956, MRN [id number]  PCP:  Dema SeverinYork, Regina F, NP  Cardiologist:  Norman HerrlichBrian Munley, MD   Referring MD: Dema SeverinYork, Regina F, NP  ASSESSMENT:    1. Chest pain, unspecified type   2. Shortness of breath    PLAN:    In order of problems listed above:  1. Chest pain is quite atypical most consistent with costochondral chronic pattern.  I reviewed the benign nature with her and she thinks she can tolerate a nonsteroidal placed on Celebrex if unimproved referral for physical therapy modalities iontophoresis of high intensity steroid.  For reassurance and with vascular atherosclerosis or calcification we will check a myocardial perfusion study in my office.  If abnormal or severe distal lipidemia should require statin therapy. 2. 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  Concerns regarding medicines are outlined above.  Orders Placed This Encounter  Procedures  . MYOCARDIAL PERFUSION IMAGING  . EKG 12-Lead   Meds ordered this encounter  Medications  . celecoxib (CELEBREX) 100 MG capsule    Sig: Take 1 capsule (100 mg total) by mouth 2 (two) times daily.    Dispense:  60 capsule    Refill:  3     Chief Complaint  Patient presents with  . Chest Pain    History of Present Illness:    Leslie BarrierDonna Schwartz is a 63 y.o. female who is being seen today for the evaluation of chest pain at the request of York, Regina F, NP.  She has not done well during COVID-19 and is having problems with anxiety and depression.  Associated with this she has been having chest pain that she describes as momentary sharp and localized left sternal border.  She finds her self with marked malaise and fatigue but not exercise intolerance and she is not short of breath.  She is a little bit concerned because a CT of the  chest shows emphysema she has about a 60-pack-year smoking history in the past and aortic atherosclerosis and has shortness of breath for more than usual activities.  No cough or wheezing..  She is not having exertional angina edema orthopnea palpitation or syncope.  She has no known history of congenital or rheumatic heart disease.  She has been on fenofibrate for hyperlipidemia was withdrawn 7 labs tomorrow morning with her PCP.  She has no history of ulcer but has repetitive vomiting and has had a recent seizure.  She has been seen by GI and frequent visits with her PCP.  Her EKG in my office today is normal.  Past Medical History:  Diagnosis Date  . Anxiety   . Asthma   . Hyperlipidemia   . Sleep apnea   . Sleep apnea     Past Surgical History:  Procedure Laterality Date  . ABDOMINAL HYSTERECTOMY      Current Medications: Current Meds  Medication Sig  . Albuterol Sulfate (PROAIR HFA IN) Inhale 1 puff into the lungs as needed.   . clonazePAM (KLONOPIN) 1 MG tablet TK 1 T PO TID  . EPINEPHrine (EPIPEN 2-PAK IJ) Inject as directed once as needed.  . levETIRAcetam (KEPPRA) 500 MG tablet Take 500 mg by mouth 2 (two) times daily.  . ondansetron (ZOFRAN-ODT) 4 MG disintegrating tablet Take 4 mg by mouth 2 (two) times daily.  .Marland Kitchen  topiramate (TOPAMAX) 50 MG tablet Take 50 mg by mouth 2 (two) times daily.  . TRINTELLIX 10 MG TABS tablet Take 10 mg by mouth daily.     Allergies:   Reglan [metoclopramide] and Sulfa antibiotics   Social History   Socioeconomic History  . Marital status: Unknown    Spouse name: Not on file  . Number of children: Not on file  . Years of education: Not on file  . Highest education level: Not on file  Occupational History  . Not on file  Social Needs  . Financial resource strain: Not on file  . Food insecurity    Worry: Not on file    Inability: Not on file  . Transportation needs    Medical: Not on file    Non-medical: Not on file  Tobacco Use  .  Smoking status: Current Some Day Smoker    Types: Cigarettes  . Smokeless tobacco: Never Used  Substance and Sexual Activity  . Alcohol use: Not Currently  . Drug use: Never  . Sexual activity: Not on file  Lifestyle  . Physical activity    Days per week: Not on file    Minutes per session: Not on file  . Stress: Not on file  Relationships  . Social Herbalist on phone: Not on file    Gets together: Not on file    Attends religious service: Not on file    Active member of club or organization: Not on file    Attends meetings of clubs or organizations: Not on file    Relationship status: Not on file  Other Topics Concern  . Not on file  Social History Narrative  . Not on file     Family History: The patient's family history includes Cancer in her father; Hyperlipidemia in her mother; Hypertension in her mother.  ROS:   Review of Systems  Constitution: Positive for malaise/fatigue.  HENT: Negative.   Eyes: Negative.   Cardiovascular: Positive for chest pain.  Respiratory: Negative.   Endocrine: Negative.   Hematologic/Lymphatic: Negative.   Skin: Negative.   Musculoskeletal: Negative.   Gastrointestinal: Positive for nausea and vomiting.  Genitourinary: Negative.   Neurological: Positive for seizures.  Psychiatric/Behavioral: Negative.   Allergic/Immunologic: Negative.    Please see the history of present illness.     All other systems reviewed and are negative.  EKGs/Labs/Other Studies Reviewed:    The following studies were reviewed today:   EKG:  EKG is ordered today.  The ekg ordered today is personally reviewed and demonstrates sinus rhythm normal EKG  Recent Labs: Requested from the PCP  Physical Exam:    VS:  BP 118/72 (BP Location: Left Arm, Patient Position: Sitting, Cuff Size: Normal)   Pulse 61   Ht 5\' 5"  (1.651 m)   Wt 115 lb 9.6 oz (52.4 kg)   SpO2 97%   BMI 19.24 kg/m     Wt Readings from Last 3 Encounters:  09/12/19 115 lb  9.6 oz (52.4 kg)  05/30/18 138 lb 8 oz (62.8 kg)  03/14/18 142 lb (64.4 kg)     GEN: Frail-appearing well nourished, well developed in no acute distress HEENT: Normal NECK: No JVD; No carotid bruits LYMPHATICS: No lymphadenopathy CARDIAC: Left costochondral junction tenderness reproducing her clinical complaint RRR, no murmurs, rubs, gallops RESPIRATORY:  Clear to auscultation without rales, wheezing or rhonchi  ABDOMEN: Soft, non-tender, non-distended MUSCULOSKELETAL:  No edema; No deformity  SKIN: Warm and dry NEUROLOGIC:  Alert and oriented x 3 PSYCHIATRIC:  Normal affect     Signed, Norman Herrlich, MD  09/12/2019 5:15 PM    [facility]

## 2019-09-18 ENCOUNTER — Telehealth: Payer: Self-pay | Admitting: *Deleted

## 2019-09-18 NOTE — Telephone Encounter (Signed)
Patient given detailed instructions per Myocardial Perfusion Study Information Sheet for the test on 09/26/2019 at 0800. Patient notified to arrive 15 minutes early and that it is imperative to arrive on time for appointment to keep from having the test rescheduled.  If you need to cancel or reschedule your appointment, please call the office within 24 hours of your appointment. . Patient verbalized understanding.[name], [name]

## 2019-09-26 ENCOUNTER — Ambulatory Visit (INDEPENDENT_AMBULATORY_CARE_PROVIDER_SITE_OTHER): Payer: Medicare Other

## 2019-09-26 ENCOUNTER — Other Ambulatory Visit: Payer: Self-pay

## 2019-09-26 VITALS — Ht 66.0 in | Wt 115.0 lb

## 2019-09-26 DIAGNOSIS — R079 Chest pain, unspecified: Secondary | ICD-10-CM | POA: Diagnosis not present

## 2019-09-26 DIAGNOSIS — R0602 Shortness of breath: Secondary | ICD-10-CM

## 2019-09-26 LAB — MYOCARDIAL PERFUSION IMAGING
LV dias vol: 72 mL (ref 46–106)
LV sys vol: 32 mL
Peak HR: 105 {beats}/min
Rest HR: 49 {beats}/min
SDS: 3
SRS: 5
SSS: 8
TID: 1.07

## 2019-09-26 MED ORDER — REGADENOSON 0.4 MG/5ML IV SOLN: 0.4000 mg | Freq: Once | INTRAVENOUS | Status: AC

## 2019-09-26 MED ORDER — TECHNETIUM TC 99M TETROFOSMIN IV KIT
10.1000 | PACK | Freq: Once | INTRAVENOUS | Status: AC | PRN
  Administered 2019-09-26: 10.1 via INTRAVENOUS

## 2019-09-26 MED ORDER — ADENOSINE (DIAGNOSTIC) 3 MG/ML IV SOLN
0.5600 mg/kg | Freq: Once | INTRAVENOUS | Status: AC
  Administered 2019-09-26: 29.1 mg via INTRAVENOUS

## 2019-09-26 MED ORDER — TECHNETIUM TC 99M TETROFOSMIN IV KIT
31.9000 | PACK | Freq: Once | INTRAVENOUS | Status: AC | PRN
  Administered 2019-09-26: 31.9 via INTRAVENOUS

## 2019-10-11 ENCOUNTER — Telehealth: Payer: Self-pay | Admitting: Internal Medicine

## 2019-10-11 NOTE — Telephone Encounter (Signed)
Rec'd from [facility] forwarded 41 pages to Dr. Jerene Bears

## 2019-10-11 NOTE — Telephone Encounter (Signed)
DOD 09/21/19  Dr. Hilarie Fredrickson, there is a referral to evaluate Mrs. Dorfman for N/V.  She has had an OV with Dr. Melina Copa and does not wish to go back because "Dr. Melina Copa had prescribed a medication that caused her to have a seizure and hit her head."  Her GI records will be sent to you for review.  Please advise scheduling.

## 2019-10-28 NOTE — Progress Notes (Addendum)
Cardiology Office Note:    Date:  10/30/2019   ID:  Leslie Schwartz, DOB 05/10/1956, MRN [id number]  PCP:  Imagene Riches, NP  Cardiologist:  Shirlee More, MD    Referring MD: Imagene Riches, NP    ASSESSMENT:    1. Chest pain, unspecified type   2. Thoracic aorta atherosclerosis ([place])   3. Costochondritis    PLAN:    In order of problems listed above:  1. She continues to have typical costochondral pain syndrome point tenderness fortunately never took an oral nonsteroidal anti-inflammatory drug with her GI problems I will put her on topical Voltaren small bump twice a day advised her to have GI evaluation for unexplained weight loss nausea and vomiting concerns of gastroparesis   Next appointment: As needed   Medication Adjustments/Labs and Tests Ordered: Current medicines are reviewed at length with the patient today.  Concerns regarding medicines are outlined above.  No orders of the defined types were placed in this encounter.  No orders of the defined types were placed in this encounter.   Chief Complaint  Patient presents with  . Follow-up    History of Present Illness:    Leslie Schwartz is a 64 y.o. 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  She has felt to be at higher cardiovascular risk with aortic atherosclerosis on CT scan and underwent an ischemia evaluation.  That CT scan also showed emphysema. Compliance with diet, lifestyle and medications: yes She underwent a myocardial perfusion study performed 09/26/2019 which was normal. Study Highlights  The left ventricular ejection fraction is normal (55-65%).  Nuclear stress EF: 55%.  There was no ST segment deviation noted during stress.  This is a low risk study.  No evidence of ischemia or MI.  Normal LVEF.   When seen by me her predominant concern is greater than 60 pound weight loss  and ability to eat she vomits up undigested food at times hours afterwards has undergone 1 GI evaluation I am concerned she may have gastroparesis I hand wrote a note asking her to show to the GI doctor I certainly do not want to give her an oral nonsteroidal anti-inflammatory drug.  She is weak from in her ability to nourish and weight loss but no angina shortness of breath palpitation or syncope Past Medical History:  Diagnosis Date  . Anxiety   . Asthma   . COPD (chronic obstructive pulmonary disease) ([place])   . Hyperlipidemia   . Sleep apnea     Past Surgical History:  Procedure Laterality Date  . ABDOMINAL HYSTERECTOMY      Current Medications: Current Meds  Medication Sig  . Albuterol Sulfate (PROAIR HFA IN) Inhale 1 puff into the lungs as needed.   . clonazePAM (KLONOPIN) 1 MG tablet TK 1 T PO TID  . levETIRAcetam (KEPPRA) 500 MG tablet Take 500 mg by mouth 2 (two) times daily.  . ondansetron (ZOFRAN-ODT) 4 MG disintegrating tablet Take 4 mg by mouth 2 (two) times daily.  Marland Kitchen tiZANidine (ZANAFLEX) 4 MG tablet Take 4 mg by mouth every 8 (eight) hours.  . topiramate (TOPAMAX) 50 MG tablet Take 50 mg by mouth 2 (two) times daily.  . TRINTELLIX 10 MG TABS tablet Take 20 mg by mouth daily.    Current Facility-Administered Medications for the 10/30/19 encounter (Office Visit) with Richardo Priest, MD  Medication  . regadenoson (LEXISCAN)  injection SOLN 0.4 mg     Allergies:   Reglan [metoclopramide] and Sulfa antibiotics   Social History   Socioeconomic History  . Marital status: Unknown    Spouse name: Not on file  . Number of children: Not on file  . Years of education: Not on file  . Highest education level: Not on file  Occupational History  . Not on file  Tobacco Use  . Smoking status: Former Smoker    Types: Cigarettes    Quit date: 07/30/2019    Years since quitting: 0.2  . Smokeless tobacco: Never Used  Substance and Sexual Activity  . Alcohol use: Not Currently   . Drug use: Never  . Sexual activity: Not on file  Other Topics Concern  . Not on file  Social History Narrative  . Not on file   Social Determinants of Health   Financial Resource Strain:   . Difficulty of Paying Living Expenses: Not on file  Food Insecurity:   . Worried About Programme researcher, broadcasting/film/video in the Last Year: Not on file  . Ran Out of Food in the Last Year: Not on file  Transportation Needs:   . Lack of Transportation (Medical): Not on file  . Lack of Transportation (Non-Medical): Not on file  Physical Activity:   . Days of Exercise per Week: Not on file  . Minutes of Exercise per Session: Not on file  Stress:   . Feeling of Stress : Not on file  Social Connections:   . Frequency of Communication with Friends and Family: Not on file  . Frequency of Social Gatherings with Friends and Family: Not on file  . Attends Religious Services: Not on file  . Active Member of Clubs or Organizations: Not on file  . Attends Banker Meetings: Not on file  . Marital Status: Not on file     Family History: The patient's family history includes Cancer in her father; Hyperlipidemia in her mother; Hypertension in her mother. ROS:   Please see the history of present illness.    All other systems reviewed and are negative.  EKGs/Labs/Other Studies Reviewed:    The following studies were reviewed today:   Physical Exam:    VS:  BP (!) 150/84 (BP Location: Right Arm, Patient Position: Sitting, Cuff Size: Normal)   Pulse 86   Ht 5\' 6"  (1.676 m)   Wt 112 lb (50.8 kg)   SpO2 97%   BMI 18.08 kg/m     Wt Readings from Last 3 Encounters:  10/30/19 112 lb (50.8 kg)  09/26/19 115 lb (52.2 kg)  09/12/19 115 lb 9.6 oz (52.4 kg)     GEN: Gaunt cachexic BMI  Well nourished, well developed in no acute distress HEENT: Normal NECK: No JVD; No carotid bruits LYMPHATICS: No lymphadenopathy CARDIAC: RRR, no murmurs, rubs, gallops RESPIRATORY:  Clear to auscultation without  rales, wheezing or rhonchi  ABDOMEN: Soft, non-tender, non-distended MUSCULOSKELETAL:  No edema; No deformity  SKIN: Warm and dry NEUROLOGIC:  Alert and oriented x 3 PSYCHIATRIC:  Normal affect    Signed, 14/02/20, MD  10/30/2019 4:06 PM     [facility]

## 2019-10-30 ENCOUNTER — Encounter: Payer: Self-pay | Admitting: Cardiology

## 2019-10-30 ENCOUNTER — Ambulatory Visit (INDEPENDENT_AMBULATORY_CARE_PROVIDER_SITE_OTHER): Payer: Medicare Other | Admitting: Cardiology

## 2019-10-30 ENCOUNTER — Other Ambulatory Visit: Payer: Self-pay

## 2019-10-30 VITALS — BP 150/84 | HR 86 | Ht 66.0 in | Wt 112.0 lb

## 2019-10-30 DIAGNOSIS — I700 Atherosclerosis of aorta: Secondary | ICD-10-CM | POA: Diagnosis not present

## 2019-10-30 DIAGNOSIS — R079 Chest pain, unspecified: Secondary | ICD-10-CM | POA: Diagnosis not present

## 2019-10-30 DIAGNOSIS — R634 Abnormal weight loss: Secondary | ICD-10-CM | POA: Diagnosis not present

## 2019-10-30 DIAGNOSIS — M940 Chondrocostal junction syndrome [Tietze]: Secondary | ICD-10-CM

## 2019-10-30 NOTE — Patient Instructions (Addendum)
Medication Instructions:  Purchase some over the counter topical Voltaren   *If you need a refill on your cardiac medications before your next appointment, please call your pharmacy*  Lab Work: None ordered   If you have labs (blood work) drawn today and your tests are completely normal, you will receive your results only by: Leslie Schwartz MyChart Message (if you have MyChart) OR . A paper copy in the mail If you have any lab test that is abnormal or we need to change your treatment, we will call you to review the results.  Testing/Procedures: None ordered   Follow-Up: At [facility], you and your health needs are our priority.  As part of our continuing mission to provide you with exceptional heart care, we have created designated Provider Care Teams.  These Care Teams include your primary Cardiologist (physician) and Advanced Practice Providers (APPs -  Physician Assistants and Nurse Practitioners) who all work together to provide you with the care you need, when you need it.  Your next appointment:   You can follow up with our office as needed  Other Instructions  Costochondritis Costochondritis is swelling and irritation (inflammation) of the tissue (cartilage) that connects your ribs to your breastbone (sternum). This causes pain in the front of your chest. Usually, the pain:  Starts gradually.  Is in more than one rib. This condition usually goes away on its own over time. Follow these instructions at home:  Do not do anything that makes your pain worse.  If directed, put ice on the painful area: ? Put ice in a plastic bag. ? Place a towel between your skin and the bag. ? Leave the ice on for 20 minutes, 2-3 times a day.  If directed, put heat on the affected area as often as told by your doctor. Use the heat source that your doctor tells you to use, such as a moist heat pack or a heating pad. ? Place a towel between your skin and the heat source. ? Leave the heat on for 20-30  minutes. ? Take off the heat if your skin turns bright red. This is very important if you cannot feel pain, heat, or cold. You may have a greater risk of getting burned.  Take over-the-counter and prescription medicines only as told by your doctor.  Return to your normal activities as told by your doctor. Ask your doctor what activities are safe for you.  Keep all follow-up visits as told by your doctor. This is important. Contact a doctor if:  You have chills or a fever.  Your pain does not go away or it gets worse.  You have a cough that does not go away. Get help right away if:  You are short of breath. This information is not intended to replace advice given to you by your health care provider. Make sure you discuss any questions you have with your health care provider. Document Revised: 10/12/2017 Document Reviewed: 01/21/2016 Elsevier Patient Education  2020 [employer].

## 2019-11-01 NOTE — Telephone Encounter (Signed)
Records are on physician's desk.

## 2019-11-02 ENCOUNTER — Encounter: Payer: Self-pay | Admitting: Internal Medicine

## 2019-11-12 ENCOUNTER — Telehealth: Payer: Self-pay | Admitting: Cardiology

## 2019-11-12 DIAGNOSIS — R112 Nausea with vomiting, unspecified: Secondary | ICD-10-CM | POA: Insufficient documentation

## 2019-11-12 NOTE — Telephone Encounter (Signed)
Patient received a bill from Dr. Bing Matter  For $217.86 dated 09-26-19. This was the date of her Myocardial Perfusion test. She has never seen Dr. Kirtland Bouchard in the office, she has only seen Dr. Dulce Sellar.  She also states that between her two insurances she should not have to pay anything out of pocket. She would like someone to provider her with more information about this bill

## 2019-12-10 ENCOUNTER — Encounter: Payer: Self-pay | Admitting: *Deleted

## 2019-12-11 ENCOUNTER — Ambulatory Visit: Payer: Medicare Other | Admitting: Internal Medicine

## 2019-12-11 ENCOUNTER — Encounter: Payer: Self-pay | Admitting: Internal Medicine

## 2019-12-11 VITALS — BP 100/60 | HR 56 | Temp 97.2°F | Ht 65.0 in | Wt 111.0 lb

## 2019-12-11 DIAGNOSIS — R1111 Vomiting without nausea: Secondary | ICD-10-CM

## 2019-12-11 MED ORDER — ONDANSETRON 4 MG PO TBDP: 4.0000 mg | ORAL_TABLET | Freq: Two times a day (BID) | ORAL | 2 refills | Status: DC

## 2019-12-11 NOTE — Patient Instructions (Addendum)
We have sent the following medications to your pharmacy for you to pick up at your convenience: Zofran ODT-Take 1-2 tables by mouth twice daily as needed  Discontinue Miralax.  Please follow up with Dr Rhea Belton in 3 months.  You have been scheduled for a 4 hour gastric emptying scan at [facility] on Wednesday 12/19/19 at 8:00 am. Please arrive at least 30 minutes prior to your appointment for registration. Please make certain not to have anything to eat or drink after midnight the night before your test. Hold all stomach medications (ex: Zofran, phenergan, Reglan) 48 hours prior to your test. If you need to reschedule your appointment, please contact radiology scheduling at [phone]. _____________________________________________________________________ A gastric-emptying study measures how long it takes for food to move through your stomach. There are several ways to measure stomach emptying. In the most common test, you eat food that contains a small amount of radioactive material. A scanner that detects the movement of the radioactive material is placed over your abdomen to monitor the rate at which food leaves your stomach. This test normally takes about 4 hours to complete. __________________________________________________________________  If you are age 49 or older, your body mass index should be between 23-30. Your Body mass index is 18.47 kg/m. If this is out of the aforementioned range listed, please consider follow up with your Primary Care Provider.  If you are age 72 or younger, your body mass index should be between 19-25. Your Body mass index is 18.47 kg/m. If this is out of the aformentioned range listed, please consider follow up with your Primary Care Provider.  __________________________________________________________  Due to recent changes in healthcare laws, you may see the results of your imaging and laboratory studies on MyChart before your provider has had  a chance to review them.  We understand that in some cases there may be results that are confusing or concerning to you. Not all laboratory results come back in the same time frame and the provider may be waiting for multiple results in order to interpret others.  Please give [place] 48 hours in order for your provider to thoroughly review all the results before contacting the office for clarification of your results.

## 2019-12-11 NOTE — Progress Notes (Signed)
Patient ID: Leslie Schwartz, female   DOB: [birth year], 64 y.o.   MRN: [id number] HPI: Leslie Schwartz is a 64 year old female with a past medical history of recurrent vomiting, history of gastritis not related to H. pylori, history of constipation, seizure disorder, anxiety and depression who is seen to evaluate vomiting.  She is here today with her mother.  She was previously seen by Dr. Charm Schwartz in [place] and she is seeking another opinion regarding her vomiting.  She reports that over the last year she has struggled with intermittent vomiting.  This is not really associated with nausea or abdominal pain.  She does have early satiety.  She has lost about 70 pounds in the last 12 months (she reports having been 180 pounds 12 months ago and now is about 110 pounds).  She describes periods of vomiting which can be quite severe and associated with eating or not eating though does seem to be worse with eating and drinking.  She will have other periods when the vomiting is not present.  She does at times feel scared to eat over fear of vomiting.  Can occur quickly after eating or drinking but can also be episodic and involve vomiting food that she ate 6 to 8 hours previously.  She also notices that her vomiting seems to be worse with stress and she can vomit even if startled.  She is not had vomiting in the past week.  Her bowel movements for the most part have been constipated most of her life and she takes MiraLAX in her coffee.  However over the last 2 to 3 weeks she has noticed stools have been looser occurring 2-3 times per day.  No blood in her stool or melena.  On further questioning it seems like symptoms have intermittently been present dating back to 04/11/2018when her ex-husband died.  She is also been helping her son during this period.  She has been using ondansetron 4 mg oral disintegrating tablets 1 to 2 tablets 2 times a day.  This has significantly helped her vomiting.  It has not led to any weight  gain.  Records reviewed from [facility] but also from Dr. Silvana Schwartz GI practice.  She has had an abdominal and pelvic CT scan in July 2020, barium swallow July 2020, EGD August 2020 which revealed mild gastritis negative for H. pylori.  Prior normal colonoscopy in 2014.  And a normal chest x-ray.  Of note she was given a trial of metoclopramide therapy but had a fall and a seizure.  This was not witnessed.  Past Medical History:  Diagnosis Date  . Anemia   . Anxiety   . Aortic atherosclerosis ([facility])   . Asthma   . Bipolar disorder ([facility])   . COPD (chronic obstructive pulmonary disease) ([facility])   . Depression   . Gastritis   . Hyperlipidemia   . Irritable bowel syndrome with constipation   . Osteoporosis   . PTSD (post-traumatic stress disorder)   . Sleep apnea   . Vitamin D insufficiency     Past Surgical History:  Procedure Laterality Date  . ABDOMINAL HYSTERECTOMY  1998  . EXCISION OF ABDOMINAL WALL TUMOR      Outpatient Medications Prior to Visit  Medication Sig Dispense Refill  . Albuterol Sulfate (PROAIR HFA IN) Inhale 1 puff into the lungs as needed.     Marland Kitchen alendronate (FOSAMAX) 70 MG tablet Take 70 mg by mouth once a week. Take with a full glass of  water on an empty stomach.    . celecoxib (CELEBREX) 100 MG capsule Take 100 mg by mouth 2 (two) times daily.    . clonazePAM (KLONOPIN) 1 MG tablet TK 1 T PO TID  2  . levETIRAcetam (KEPPRA) 500 MG tablet Take 500 mg by mouth 2 (two) times daily.    Marland Kitchen tiZANidine (ZANAFLEX) 4 MG tablet Take 4 mg by mouth every 8 (eight) hours.    . TRINTELLIX 10 MG TABS tablet Take 20 mg by mouth daily.     . ondansetron (ZOFRAN-ODT) 4 MG disintegrating tablet Take 4 mg by mouth 2 (two) times daily.    Marland Kitchen topiramate (TOPAMAX) 50 MG tablet Take 50 mg by mouth 2 (two) times daily.     Facility-Administered Medications Prior to Visit  Medication Dose Route Frequency Provider Last Rate Last Admin  . regadenoson (LEXISCAN) injection  SOLN 0.4 mg  0.4 mg Intravenous Once Park Liter, MD        Allergies  Allergen Reactions  . Reglan [Metoclopramide] Other (See Comments)    Seizures   . Macrolides And Ketolides   . Sulfa Antibiotics Itching    Family History  Problem Relation Age of Onset  . Hyperlipidemia Mother   . Hypertension Mother   . Lung cancer Father   . Colon cancer Neg Hx     Social History   Tobacco Use  . Smoking status: Former Smoker    Types: Cigarettes    Quit date: 07/30/2019    Years since quitting: 0.3  . Smokeless tobacco: Never Used  Substance Use Topics  . Alcohol use: Yes    Comment: wine every 2 months  . Drug use: Never    ROS: As per history of present illness, otherwise negative  BP 100/60   Pulse (!) 56   Temp (!) 97.2 F (36.2 C)   Ht 5\' 5"  (1.651 m)   Wt 111 lb (50.3 kg)   BMI 18.47 kg/m  Gen: awake, alert, NAD HEENT: anicteric CV: RRR, no mrg Pulm: CTA b/l Abd: soft, NT/ND, +BS throughout Ext: no c/c/e Neuro: nonfocal  Blood work dated 09/13/2019 CMP within normal limits with exception of a mildly low CO2 at 21, notably liver enzymes are normal.  TSH normal.  Hemoglobin 12.2, MCV 91.4, platelet count 218, white count 5.4  CT ABDOMEN AND PELVIS WITH CONTRAST  TECHNIQUE: Multidetector CT imaging of the abdomen and pelvis was performed using the standard protocol following bolus administration of intravenous contrast.  CONTRAST: 80 cc Isovue 370  COMPARISON: None.  FINDINGS: Lower chest: No acute abnormality.  Hepatobiliary: No focal liver abnormality is seen. No gallstones, gallbladder wall thickening, or biliary dilatation.  Pancreas: Unremarkable. No pancreatic ductal dilatation or surrounding inflammatory changes.  Spleen: Normal in size without focal abnormality.  Adrenals/Urinary Tract: Adrenal glands appear normal. Kidneys are unremarkable without mass, stone or hydronephrosis. No ureteral or bladder calculi  identified.  Stomach/Bowel: No dilated large or small bowel loops. No evidence of bowel wall inflammation seen. Stomach is unremarkable, partially decompressed. Appendix is normal.  Vascular/Lymphatic: Aortic atherosclerosis. No enlarged abdominal or pelvic lymph nodes.  Reproductive: Presumed hysterectomy. No adnexal mass or free fluid seen.  Other: No free fluid or abscess. No soft tissue mass. No free intraperitoneal air.  Musculoskeletal: No acute or suspicious osseous finding.  IMPRESSION: 1. No acute findings within the abdomen or pelvis. No source for abdominal pain identified. No bowel obstruction or evidence of bowel wall inflammation. No evidence of  acute solid organ abnormality. No renal or ureteral calculi. Appendix is normal. 2. Aortic atherosclerosis.  Aortic Atherosclerosis (ICD10-I70.0).   Electronically Signed By: Bary Richard M.D. On: 04/23/2019 13:54  ASSESSMENT/PLAN: 64 year old female with a past medical history of recurrent vomiting, history of gastritis not related to H. pylori, history of constipation, anxiety and depression who is seen to evaluate vomiting.  1.  Recurrent vomiting --differential diagnosis at this point after upper endoscopy, barium esophagram, CT scan of the abdomen and pelvis includes gastroparesis and cyclic vomiting syndrome.  Medication induced vomiting is felt less likely.  We discussed her symptoms today.  She lacks nausea but does have early satiety.  She also gives history of vomiting food eaten 6 to 8 hours previously which would argue more for gastroparesis.  I recommended that we formally evaluate though she is intolerant/allergic to metoclopramide and we discussed the very limited promotility agents which are on the market.  I did go over a gastroparesis diet with her today but we will formalized with gastric emptying scan --4-hour gastric emptying scan --Ondansetron ODT 4 to 8 mg twice daily as needed --If gastric emptying  scan is normal would consider prophylaxis for possible cyclic vomiting syndrome with amitriptyline.  We would need to talk to Mauricio Po, NP to discuss amitriptyline given that she is already on Trintellix.  2. CRC screening -- normal colon in 2014, repeat recommended in 2024  JK:QASU, Orie Rout, [address] [place],  [place] [address]

## 2019-12-31 ENCOUNTER — Telehealth: Payer: Self-pay | Admitting: Internal Medicine

## 2019-12-31 NOTE — Telephone Encounter (Signed)
Pt stated Dr. Rhea Belton had referred her to have gastric emptying at [facility].  Pt stated that it was done on 12/19/19 and inquired about results.

## 2019-12-31 NOTE — Telephone Encounter (Signed)
Pt calling regarding GES scan. Report is in Dr. Lauro Franklin inbox. Please advise.

## 2020-01-02 NOTE — Telephone Encounter (Signed)
GES is abnormal at 4 hours, which indicates she has gastroparesis.  She has been intolerant to reglan, which is the only longterm FDA approved promotility agent on the market.   The best treatment is to follow a gastroparesis diet.  I believe I gave her a copy of this at her recent appt ,if not please mail her one. Also followup with me is recommended to further discuss When symptoms are severe, we could consider short courses of liquid erythromycin, but this is not a longterm option due to tachyphylaxis  We will discuss when I see her next Please let me know if she has questions

## 2020-01-03 ENCOUNTER — Other Ambulatory Visit: Payer: Self-pay | Admitting: Cardiology

## 2020-01-03 NOTE — Telephone Encounter (Signed)
Spoke with pt and she is aware. States she does not think she needs to come back in for an OV, she will call back if she starts having symptoms that she feels she cannot handle and would possibly be interested in liquid erythromycin. Dr. Terri Piedra aware.

## 2020-01-09 NOTE — Telephone Encounter (Signed)
Patient states her symptoms are getting worse and would like to have the liquid antibiotic called in for her.

## 2020-01-09 NOTE — Telephone Encounter (Signed)
Pt states she is having diarrhea and vomiting. She wants the liquid erythromycin sent to the pharmacy. Pt knows that Dr. Rhea Belton is out and will not be back until next week. Discussed with pt that there is a GI bug going around also causing diarrhea and nausea. Pt instructed to take Imodium for the diarrhea. Pt states she is vomiting up the zofran odt and thinks she needs IV fluids. Pt instructed to go to the ER if she is not able to eat or keep liquids/meds down as they can give her IV fluids and IV antinausea meds. Pt verbalized understanding.  Please advise regarding liquid erythromycin.

## 2020-01-11 ENCOUNTER — Other Ambulatory Visit: Payer: Self-pay | Admitting: Nurse Practitioner

## 2020-01-11 ENCOUNTER — Telehealth: Payer: Self-pay | Admitting: Nurse Practitioner

## 2020-01-11 MED ORDER — ERYTHROMYCIN ETHYLSUCCINATE 200 MG/5ML PO SUSR: 250.0000 mg | Freq: Three times a day (TID) | ORAL | Status: DC

## 2020-01-11 NOTE — Telephone Encounter (Signed)
Patient called answering service. She has been waiting on erythromycin to be called to pharmacy. Vomited up banana today. Cannot tolerate Reglan.  Will send Rx for Erythromycin suspension 250 mg to take ac TID. Will give 10 day supply

## 2020-01-14 ENCOUNTER — Telehealth: Payer: Self-pay | Admitting: Internal Medicine

## 2020-01-14 MED ORDER — ERYTHROMYCIN ETHYLSUCCINATE 200 MG/5ML PO SUSR: 200.0000 mg | Freq: Three times a day (TID) | ORAL | 0 refills | Status: AC

## 2020-01-14 NOTE — Telephone Encounter (Signed)
Patient scheduled to see Dr. Rhea Belton 02/19/20@2 :30pm, appt letter mailed to pt.

## 2020-01-14 NOTE — Telephone Encounter (Signed)
Thank you Leslie Schwartz Patient should have office follow-up within 4 to 6 weeks if not available with me then APP

## 2020-01-14 NOTE — Telephone Encounter (Signed)
I have spoken to Willette Cluster, NP who indicates she tried over the weekend to send erythromycin suspension 250 mg TID for total of 10 days. Unfortunately, that medication was sent under hospital order so it did not get to pharmacy. In outpatient setting, only erythromycin ethylsuccinate 200 mgm/5 ml suspension is available rather than solution making 250 mg dosage possible. I have spoken to Dr Rhea Belton since Willette Cluster, NP is not available now and he indicates that patient may be given erythromycin ethylsuccinate 200 mg suspension (5 ml) three times daily x 10 days instead. Rx has been sent. Patient has been advised of this and verbalizes understanding.

## 2020-02-12 ENCOUNTER — Encounter: Payer: Self-pay | Admitting: *Deleted

## 2020-02-19 ENCOUNTER — Ambulatory Visit: Payer: Medicare Other | Admitting: Internal Medicine

## 2020-02-21 NOTE — Telephone Encounter (Signed)
Error

## 2020-03-12 ENCOUNTER — Ambulatory Visit: Payer: Medicare Other | Admitting: Internal Medicine

## 2020-06-10 DIAGNOSIS — Z716 Tobacco abuse counseling: Secondary | ICD-10-CM | POA: Insufficient documentation

## 2020-06-10 DIAGNOSIS — R110 Nausea: Secondary | ICD-10-CM | POA: Insufficient documentation

## 2020-06-26 DIAGNOSIS — T50905A Adverse effect of unspecified drugs, medicaments and biological substances, initial encounter: Secondary | ICD-10-CM | POA: Insufficient documentation

## 2020-07-17 DIAGNOSIS — F332 Major depressive disorder, recurrent severe without psychotic features: Secondary | ICD-10-CM | POA: Insufficient documentation

## 2020-11-10 ENCOUNTER — Ambulatory Visit (INDEPENDENT_AMBULATORY_CARE_PROVIDER_SITE_OTHER): Payer: Medicare Other | Admitting: Orthopaedic Surgery

## 2020-11-10 ENCOUNTER — Ambulatory Visit: Payer: Self-pay

## 2020-11-10 ENCOUNTER — Encounter: Payer: Self-pay | Admitting: Orthopaedic Surgery

## 2020-11-10 ENCOUNTER — Other Ambulatory Visit: Payer: Self-pay

## 2020-11-10 VITALS — BP 149/85 | Ht 65.0 in | Wt 109.0 lb

## 2020-11-10 DIAGNOSIS — M7062 Trochanteric bursitis, left hip: Secondary | ICD-10-CM

## 2020-11-10 DIAGNOSIS — M25551 Pain in right hip: Secondary | ICD-10-CM | POA: Diagnosis not present

## 2020-11-10 DIAGNOSIS — M25552 Pain in left hip: Secondary | ICD-10-CM | POA: Diagnosis not present

## 2020-11-10 HISTORY — DX: Trochanteric bursitis, left hip: M70.62

## 2020-11-10 MED ORDER — BUPIVACAINE HCL 0.25 % IJ SOLN
2.0000 mL | INTRAMUSCULAR | Status: AC | PRN
  Administered 2020-11-10: 2 mL via INTRA_ARTICULAR

## 2020-11-10 MED ORDER — LIDOCAINE HCL 1 % IJ SOLN
0.5000 mL | INTRAMUSCULAR | Status: AC | PRN
  Administered 2020-11-10: .5 mL

## 2020-11-10 MED ORDER — METHYLPREDNISOLONE ACETATE 40 MG/ML IJ SUSP
40.0000 mg | INTRAMUSCULAR | Status: AC | PRN
  Administered 2020-11-10: 40 mg via INTRA_ARTICULAR

## 2020-11-10 NOTE — Progress Notes (Signed)
Office Visit Note   Patient: Leslie Schwartz           Date of Birth: 1955/11/21           MRN: [id number] Visit Date: 11/10/2020              Requested by: Dema Severin, NP [address] [place],  [place] [address] PCP: Dema Severin, NP   Assessment & Plan: Visit Diagnoses:  1. Bilateral hip pain   2. Trochanteric bursitis, left hip     Plan: Left hip injection performed.  She can return to the opposite right hip if she has increased problems with the right future.  Follow-Up Instructions: No follow-ups on file.   Orders:  Orders Placed This Encounter  Procedures  . XR Lumbar Spine 2-3 Views  . XR Pelvis 1-2 Views   No orders of the defined types were placed in this encounter.     Procedures: Large Joint Inj: L greater trochanter on 11/10/2020 10:31 AM Details: lateral approach Medications: 0.5 mL lidocaine 1 %; 2 mL bupivacaine 0.25 %; 40 mg methylPREDNISolone acetate 40 MG/ML      Clinical Data: No additional findings.   Subjective: Chief Complaint  Patient presents with  . Left Hip - Pain  . Right Hip - Pain    HPI patient is seen with left greater than right trochanteric pain.  She has difficulty when she tries to sleep at night with discomfort with have her hip she lays on.  With ambulation said increased discomfort.  She denies fever chills no rheumatologic condition.  She has had problems with adhesive capsulitis of her shoulder in the past.  She denies bowel bladder symptoms no chills or fever no numbness or tingling in her feet.  Review of Systems all other systems noncontributory to HPI.   Objective: Vital Signs: BP (!) 149/85   Ht 5\' 5"  (1.651 m)   Wt 109 lb (49.4 kg)   BMI 18.14 kg/m   Physical Exam Constitutional:      Appearance: She is well-developed.  HENT:     Head: Normocephalic.     Right Ear: External ear normal.     Left Ear: External ear normal.  Eyes:     Pupils: Pupils are equal, round, and reactive to light.  Neck:      Thyroid: No thyromegaly.     Trachea: No tracheal deviation.  Cardiovascular:     Rate and Rhythm: Normal rate.  Pulmonary:     Effort: Pulmonary effort is normal.  Abdominal:     Palpations: Abdomen is soft.  Skin:    General: Skin is warm and dry.  Neurological:     Mental Status: She is alert and oriented to person, place, and time.  Psychiatric:        Mood and Affect: Mood and affect normal.        Behavior: Behavior normal.     Ortho Exam patient has tenderness more left greater trochanter than right negative logroll the hips negative straight leg raising 90 degrees.  Knee and ankle jerk are 2+ and symmetrical anterior tib EHL is normal.  Normal gait sequence.  Isolated motor weakness.  No sciatic notch tenderness. Specialty Comments:  No specialty comments available.  Imaging: XR Lumbar Spine 2-3 Views  Result Date: 11/10/2020 AP lateral lumbar spine x-rays obtained and reviewed.  This shows normal disc space with normal facet joints.  Trace calcification abdominal aorta. Impression: Normal lumbar spine for patient  chronologic age.  No listhesis and no significant degenerative changes.  XR Pelvis 1-2 Views  Result Date: 11/10/2020 AP pelvis to include hips demonstrates normal hip joints negative for fracture.  No hip degenerative changes are noted. Impression: Normal hip x-rays.    PMFS History: Patient Active Problem List   Diagnosis Date Noted  . Trochanteric bursitis, left hip 11/10/2020  . Adhesive capsulitis of left shoulder 05/30/2018  . Impingement syndrome of right shoulder 05/30/2018   Past Medical History:  Diagnosis Date  . Anemia   . Anxiety   . Aortic atherosclerosis (HCC)   . Asthma   . Bipolar disorder (HCC)   . COPD (chronic obstructive pulmonary disease) (HCC)   . Depression   . Gastritis   . Gastroparesis   . Hyperlipidemia   . Irritable bowel syndrome with constipation   . Osteoporosis   . PTSD (post-traumatic stress disorder)   . Sleep  apnea   . Vitamin D insufficiency     Family History  Problem Relation Age of Onset  . Hyperlipidemia Mother   . Hypertension Mother   . Lung cancer Father   . Colon cancer Neg Hx     Past Surgical History:  Procedure Laterality Date  . ABDOMINAL HYSTERECTOMY  1998  . EXCISION OF ABDOMINAL WALL TUMOR     Social History   Occupational History  . Not on file  Tobacco Use  . Smoking status: Former Smoker    Types: Cigarettes    Quit date: 07/30/2019    Years since quitting: 1.2  . Smokeless tobacco: Never Used  Vaping Use  . Vaping Use: Former  Substance and Sexual Activity  . Alcohol use: Yes    Comment: wine every 2 months  . Drug use: Never  . Sexual activity: Not on file

## 2021-01-20 ENCOUNTER — Ambulatory Visit: Payer: Medicare Other | Admitting: Orthopaedic Surgery

## 2021-01-20 VITALS — BP 177/94 | HR 63 | Ht 65.0 in | Wt 109.0 lb

## 2021-01-20 DIAGNOSIS — M7062 Trochanteric bursitis, left hip: Secondary | ICD-10-CM
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
## 2021-01-26 DIAGNOSIS — M7062 Trochanteric bursitis, left hip: Secondary | ICD-10-CM

## 2021-01-26 MED ORDER — LIDOCAINE HCL 1 % IJ SOLN
0.5000 mL | INTRAMUSCULAR | Status: AC | PRN
  Administered 2021-01-26: .5 mL

## 2021-01-26 MED ORDER — BUPIVACAINE HCL 0.25 % IJ SOLN
2.0000 mL | INTRAMUSCULAR | Status: AC | PRN
  Administered 2021-01-26: 2 mL via INTRA_ARTICULAR

## 2021-01-26 MED ORDER — METHYLPREDNISOLONE ACETATE 40 MG/ML IJ SUSP
40.0000 mg | INTRAMUSCULAR | Status: AC | PRN
Start: 2021-01-26 — End: 2021-01-26
  Administered 2021-01-26: 40 mg via INTRA_ARTICULAR

## 2021-01-27 ENCOUNTER — Encounter: Payer: Self-pay | Admitting: Orthopaedic Surgery

## 2021-01-27 ENCOUNTER — Ambulatory Visit: Payer: Medicare Other | Admitting: Orthopaedic Surgery

## 2021-01-27 VITALS — BP 127/76 | HR 68 | Ht 65.0 in | Wt 109.0 lb

## 2021-01-27 DIAGNOSIS — M25552 Pain in left hip: Secondary | ICD-10-CM

## 2021-01-27 DIAGNOSIS — M7062 Trochanteric bursitis, left hip: Secondary | ICD-10-CM
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
## 2021-01-28 DIAGNOSIS — M25552 Pain in left hip: Secondary | ICD-10-CM | POA: Insufficient documentation

## 2021-01-28 HISTORY — DX: Pain in left hip: M25.552

## 2021-01-29 DIAGNOSIS — M25512 Pain in left shoulder: Secondary | ICD-10-CM | POA: Insufficient documentation

## 2021-01-29 DIAGNOSIS — M79601 Pain in right arm: Secondary | ICD-10-CM | POA: Insufficient documentation

## 2021-01-29 DIAGNOSIS — M79602 Pain in left arm: Secondary | ICD-10-CM | POA: Insufficient documentation

## 2021-03-04 ENCOUNTER — Encounter: Payer: Self-pay | Admitting: Orthopaedic Surgery

## 2021-03-04 ENCOUNTER — Ambulatory Visit: Payer: Medicare Other | Admitting: Orthopaedic Surgery

## 2021-03-04 VITALS — BP 145/95 | HR 83 | Ht 65.0 in | Wt 131.0 lb

## 2021-03-04 DIAGNOSIS — M7062 Trochanteric bursitis, left hip: Secondary | ICD-10-CM

## 2021-03-04 DIAGNOSIS — M792 Neuralgia and neuritis, unspecified: Secondary | ICD-10-CM | POA: Insufficient documentation

## 2021-03-04 HISTORY — DX: Neuralgia and neuritis, unspecified: M79.2

## 2021-03-04 MED ORDER — BUPIVACAINE HCL 0.25 % IJ SOLN
2.0000 mL | INTRAMUSCULAR | Status: AC | PRN
  Administered 2021-03-04: 2 mL via INTRA_ARTICULAR

## 2021-03-04 MED ORDER — METHYLPREDNISOLONE ACETATE 40 MG/ML IJ SUSP
40.0000 mg | INTRAMUSCULAR | Status: AC | PRN
  Administered 2021-03-04: 40 mg via INTRA_ARTICULAR

## 2021-03-04 MED ORDER — LIDOCAINE HCL 1 % IJ SOLN
0.5000 mL | INTRAMUSCULAR | Status: AC | PRN
  Administered 2021-03-04: .5 mL

## 2021-03-04 NOTE — Addendum Note (Signed)
Addended by: Rogers Seeds on: 03/04/2021 11:13 AM   Modules accepted: Orders

## 2021-03-04 NOTE — Progress Notes (Signed)
Office Visit Note   Patient: Leslie Schwartz           Date of Birth: Jan 01, 1956           MRN: [id number] Visit Date: 03/04/2021              Requested by: Dema Severin, NP [address] [place],  [place] [address] PCP: Dema Severin, NP   Assessment & Plan: Visit Diagnoses:  1. Trochanteric bursitis, left hip   2. Radicular pain in left arm     Plan: Electrical test done by Dr.Tanvir Chodri showed increased polyphasics MUAP's in the left deltoid left ECRL and left FCR muscles as well as left biceps.  This is consistent with the C5-C6 nerve root level radicular symptoms.  We will proceed with cervical MRI due to the patient's persistent symptoms.  Office follow-up after cervical MRI scan.  Follow-Up Instructions: No follow-ups on file.   Orders:  No orders of the defined types were placed in this encounter.  No orders of the defined types were placed in this encounter.     Procedures: Large Joint Inj: L greater trochanter on 03/04/2021 10:43 AM Details: 22 G needle, lateral approach Medications: 0.5 mL lidocaine 1 %; 2 mL bupivacaine 0.25 %; 40 mg methylPREDNISolone acetate 40 MG/ML      Clinical Data: No additional findings.   Subjective: Chief Complaint  Patient presents with  . Left Hip - Pain, Follow-up    MRI left hip review    HPI 65 year old female returns she has had increased problems with lateral left hip pain.  She states she got up last week and she was having numbness in her leg which is on the right side.  She has had constant problems with numbness in her left arm and states she had electrical test done by Dr. Elyn Peers which showed some evidence of C4 and possibly C6 nerve root problems.  Those reports supposed to be faxed to [place].  Review of Systems positive for neck and left arm pain and numbness.  Previous seasonal capsulitis left shoulder.   Objective: Vital Signs: BP (!) 145/95   Pulse 83   Ht 5\' 5"  (1.651 m)   Wt 131 lb (59.4 kg)   BMI 21.80 kg/m    Physical Exam Constitutional:      Appearance: She is well-developed.  HENT:     Head: Normocephalic.     Right Ear: External ear normal.     Left Ear: External ear normal.  Eyes:     Pupils: Pupils are equal, round, and reactive to light.  Neck:     Thyroid: No thyromegaly.     Trachea: No tracheal deviation.  Cardiovascular:     Rate and Rhythm: Normal rate.  Pulmonary:     Effort: Pulmonary effort is normal.  Abdominal:     Palpations: Abdomen is soft.  Skin:    General: Skin is warm and dry.  Neurological:     Mental Status: She is alert and oriented to person, place, and time.  Psychiatric:        Behavior: Behavior normal.     Ortho Exam patient is able to heel and toe walk.  No lower extremity atrophy.  She is significantly tender over the left trochanter.  Negative logroll of the hips negative straight leg raising 90 degrees negative popliteal compression test knee and ankle jerk are intact.  Patient has brachial plexus tenderness on the left negative on the right.  No upper extremity atrophy.  Patient has trace biceps reflex trace brachial radialis on the left 2+ on the right side biceps and brachial radialis.  Triceps is symmetrical.  Slight wrist extension weakness on the left.  No FCU or finger extension weakness left or right. Specialty Comments:  No specialty comments available.  Imaging: No results found.   PMFS History: Patient Active Problem List   Diagnosis Date Noted  . Radicular pain in left arm 03/04/2021  . Pain in left hip 01/28/2021  . Trochanteric bursitis, left hip 11/10/2020  . Adhesive capsulitis of left shoulder 05/30/2018  . Impingement syndrome of right shoulder 05/30/2018   Past Medical History:  Diagnosis Date  . Anemia   . Anxiety   . Aortic atherosclerosis (HCC)   . Asthma   . Bipolar disorder (HCC)   . COPD (chronic obstructive pulmonary disease) (HCC)   . Depression   . Gastritis   . Gastroparesis   . Hyperlipidemia    . Irritable bowel syndrome with constipation   . Osteoporosis   . PTSD (post-traumatic stress disorder)   . Sleep apnea   . Vitamin D insufficiency     Family History  Problem Relation Age of Onset  . Hyperlipidemia Mother   . Hypertension Mother   . Lung cancer Father   . Colon cancer Neg Hx     Past Surgical History:  Procedure Laterality Date  . ABDOMINAL HYSTERECTOMY  1998  . EXCISION OF ABDOMINAL WALL TUMOR     Social History   Occupational History  . Not on file  Tobacco Use  . Smoking status: Former Smoker    Types: Cigarettes    Quit date: 07/30/2019    Years since quitting: 1.5  . Smokeless tobacco: Never Used  Vaping Use  . Vaping Use: Former  Substance and Sexual Activity  . Alcohol use: Yes    Comment: wine every 2 months  . Drug use: Never  . Sexual activity: Not on file

## 2021-03-13 ENCOUNTER — Telehealth: Payer: Self-pay

## 2021-03-13 NOTE — Telephone Encounter (Signed)
Faxed order to [facility], pt is aware it has been faxed

## 2021-03-13 NOTE — Telephone Encounter (Signed)
Patient called regarding her referral for mri that was supposed to be sent to [facility] patient stated a staff member at the practice told her they have not received a referral from [place] she is requesting a call [phone]

## 2021-04-28 ENCOUNTER — Ambulatory Visit: Payer: Medicare Other | Admitting: Orthopaedic Surgery

## 2021-04-28 ENCOUNTER — Other Ambulatory Visit: Payer: Self-pay

## 2021-04-28 ENCOUNTER — Encounter: Payer: Self-pay | Admitting: Orthopaedic Surgery

## 2021-04-28 VITALS — BP 108/74 | HR 75 | Ht 65.0 in | Wt 131.0 lb

## 2021-04-28 DIAGNOSIS — M792 Neuralgia and neuritis, unspecified: Secondary | ICD-10-CM

## 2021-04-28 DIAGNOSIS — E041 Nontoxic single thyroid nodule: Secondary | ICD-10-CM

## 2021-04-28 DIAGNOSIS — M4722 Other spondylosis with radiculopathy, cervical region: Secondary | ICD-10-CM

## 2021-04-28 DIAGNOSIS — M7062 Trochanteric bursitis, left hip: Secondary | ICD-10-CM

## 2021-04-28 HISTORY — DX: Other spondylosis with radiculopathy, cervical region: M47.22

## 2021-04-28 MED ORDER — LIDOCAINE HCL 1 % IJ SOLN
0.5000 mL | INTRAMUSCULAR | Status: AC | PRN
  Administered 2021-04-28: .5 mL

## 2021-04-28 MED ORDER — BUPIVACAINE HCL 0.25 % IJ SOLN
2.0000 mL | INTRAMUSCULAR | Status: AC | PRN
  Administered 2021-04-28: 2 mL via INTRA_ARTICULAR

## 2021-04-28 MED ORDER — METHYLPREDNISOLONE ACETATE 40 MG/ML IJ SUSP
40.0000 mg | INTRAMUSCULAR | Status: AC | PRN
  Administered 2021-04-28: 40 mg via INTRA_ARTICULAR
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
## 2021-04-30 ENCOUNTER — Ambulatory Visit
Admission: RE | Admit: 2021-04-30 | Discharge: 2021-04-30 | Disposition: A | Discharge status: Home / Self Care | Payer: Medicare Other | Source: Ambulatory Visit | Attending: Orthopaedic Surgery | Admitting: Orthopaedic Surgery

## 2021-04-30 ENCOUNTER — Other Ambulatory Visit: Payer: Self-pay

## 2021-04-30 DIAGNOSIS — E041 Nontoxic single thyroid nodule: Secondary | ICD-10-CM

## 2021-05-12 ENCOUNTER — Ambulatory Visit: Payer: Medicare Other | Admitting: Orthopaedic Surgery

## 2021-09-10 ENCOUNTER — Encounter: Payer: Self-pay | Admitting: Surgery

## 2021-09-10 ENCOUNTER — Other Ambulatory Visit: Payer: Self-pay

## 2021-09-10 ENCOUNTER — Ambulatory Visit (INDEPENDENT_AMBULATORY_CARE_PROVIDER_SITE_OTHER): Payer: Medicare Other | Admitting: Surgery

## 2021-09-10 VITALS — Ht 65.0 in | Wt 131.0 lb

## 2021-09-10 DIAGNOSIS — M5416 Radiculopathy, lumbar region: Secondary | ICD-10-CM

## 2021-09-10 NOTE — Progress Notes (Signed)
Office Visit Note   Patient: Leslie Schwartz           Date of Birth: [birth year]           MRN: [id number] Visit Date: 09/10/2021              Requested by: Dema Severin, NP [address] [place],  [place] [address] PCP: Dema Severin, NP   Assessment & Plan: Visit Diagnoses:  1. Radiculopathy, lumbar region     Plan: With patient's ongoing left buttock pain and left lower extremity radiculopathy that is failed conservative treatment with multiple greater trochanter bursa injections I will schedule lumbar MRI to rule out HNP/stenosis.  Patient will follow-up with Dr. Ophelia Charter in 3 weeks for recheck and to discuss results and further treatment options.  I advised patient that I do not recommend repeating lateral hip injection today.  All questions answered.  Follow-Up Instructions: Return in about 3 weeks (around 10/01/2021) for With Dr. Ophelia Charter to review lumbar MRI.   Orders:  Orders Placed This Encounter  Procedures   MR LUMBAR SPINE WO CONTRAST   No orders of the defined types were placed in this encounter.     Procedures: No procedures performed   Clinical Data: No additional findings.   Subjective: Chief Complaint  Patient presents with   Left Hip - Pain    HPI 65 year old white female returns with complaints of left buttock, lateral hip pain.  Dr. Ophelia Charter has performed for left hip greater trochanter bursa injections.  These were done in November 10, 2020, January 20, 2021, Mar 04, 2021 and April 28, 2021.  Patient called the office requesting another 1 to be done today.  After discussion patient does have pain in the left buttock, lateral hip and this radiates to her thigh just above her knee.  This is been progressively worsening.  Has had intermittent improvement of her lateral hip pain with the injections.  Previous left hip MRI that was done Feb 11, 2021 read normal MRI left hip. Review of Systems No current complaints of cardiac pulmonary GI GU issues  Objective: Vital  Signs: Ht 5\' 5"  (1.651 m)   Wt 131 lb (59.4 kg)   BMI 21.80 kg/m   Physical Exam HENT:     Head: Normocephalic.  Eyes:     Extraocular Movements: Extraocular movements intact.  Pulmonary:     Effort: No respiratory distress.  Musculoskeletal:     Comments: Gait is normal.  Patient has moderate to marked tenderness over the left sciatic notch.  Less tender over the left hip greater trochanter bursa.  Neurologically intact.  Neurological:     Mental Status: She is alert and oriented to person, place, and time.  Psychiatric:        Mood and Affect: Mood normal.    Ortho Exam  Specialty Comments:  No specialty comments available.  Imaging: No results found.   PMFS History: Patient Active Problem List   Diagnosis Date Noted   Other spondylosis with radiculopathy, cervical region 04/28/2021   Radicular pain in left arm 03/04/2021   Pain in left hip 01/28/2021   Trochanteric bursitis, left hip 11/10/2020   Adhesive capsulitis of left shoulder 05/30/2018   Impingement syndrome of right shoulder 05/30/2018   Past Medical History:  Diagnosis Date   Anemia    Anxiety    Aortic atherosclerosis (HCC)    Asthma    Bipolar disorder (HCC)    COPD (chronic obstructive pulmonary disease) (  HCC)    Depression    Gastritis    Gastroparesis    Hyperlipidemia    Irritable bowel syndrome with constipation    Osteoporosis    PTSD (post-traumatic stress disorder)    Sleep apnea    Vitamin D insufficiency     Family History  Problem Relation Age of Onset   Hyperlipidemia Mother    Hypertension Mother    Lung cancer Father    Colon cancer Neg Hx     Past Surgical History:  Procedure Laterality Date   ABDOMINAL HYSTERECTOMY  1998   EXCISION OF ABDOMINAL WALL TUMOR     Social History   Occupational History   Not on file  Tobacco Use   Smoking status: Former    Types: Cigarettes    Quit date: 07/30/2019    Years since quitting: 2.1   Smokeless tobacco: Never  Vaping  Use   Vaping Use: Former  Substance and Sexual Activity   Alcohol use: Yes    Comment: wine every 2 months   Drug use: Never   Sexual activity: Not on file

## 2021-09-25 ENCOUNTER — Telehealth: Payer: Self-pay | Admitting: Orthopaedic Surgery

## 2021-09-25 ENCOUNTER — Other Ambulatory Visit: Payer: Self-pay | Admitting: Orthopaedic Surgery

## 2021-09-25 MED ORDER — HYDROCODONE-ACETAMINOPHEN 5-325 MG PO TABS: 1.0000 | ORAL_TABLET | Freq: Four times a day (QID) | ORAL | 0 refills | Status: DC | PRN

## 2021-09-25 NOTE — Telephone Encounter (Signed)
Please advise 

## 2021-09-25 NOTE — Telephone Encounter (Signed)
Pt called requesting pain medication for her left side pains. Pt is scheduled for MRI next week but she states she need relief. She states its severe and really need to relieve pains. Please send to [facility]. Pt phone number is [phone].

## 2021-09-25 NOTE — Progress Notes (Unsigned)
norco

## 2021-10-02 ENCOUNTER — Ambulatory Visit
Admission: RE | Admit: 2021-10-02 | Discharge: 2021-10-02 | Disposition: A | Discharge status: Home / Self Care | Payer: Medicare Other | Source: Ambulatory Visit | Attending: Surgery | Admitting: Surgery

## 2021-10-02 ENCOUNTER — Other Ambulatory Visit: Payer: Self-pay

## 2021-10-02 DIAGNOSIS — M5416 Radiculopathy, lumbar region: Secondary | ICD-10-CM

## 2021-10-09 ENCOUNTER — Encounter: Payer: Self-pay | Admitting: Orthopaedic Surgery

## 2021-10-09 ENCOUNTER — Other Ambulatory Visit: Payer: Self-pay

## 2021-10-09 ENCOUNTER — Ambulatory Visit (INDEPENDENT_AMBULATORY_CARE_PROVIDER_SITE_OTHER): Payer: Medicare Other | Admitting: Orthopaedic Surgery

## 2021-10-09 VITALS — BP 116/70 | HR 64 | Ht 65.0 in | Wt 131.0 lb

## 2021-10-09 DIAGNOSIS — M5416 Radiculopathy, lumbar region: Secondary | ICD-10-CM

## 2021-10-09 MED ORDER — HYDROCODONE-ACETAMINOPHEN 5-325 MG PO TABS: 1.0000 | ORAL_TABLET | Freq: Four times a day (QID) | ORAL | 0 refills | Status: DC | PRN

## 2021-10-12 NOTE — Progress Notes (Signed)
Office Visit Note   Patient: Leslie Schwartz           Date of Birth: [birth year]           MRN: [id number] Visit Date: 10/09/2021              Requested by: Imagene Riches, NP [address] [place],  [place] [address] PCP: Imagene Riches, NP   Assessment & Plan: Visit Diagnoses:  1. Radiculopathy, lumbar region     Plan: We will set patient up for lateral ESI on the left for symptoms with some annular tears disc protrusion at the L4-5 and L5-S1 level.  Follow-Up Instructions: No follow-ups on file.   Orders:  Orders Placed This Encounter  Procedures   Ambulatory referral to Physical Medicine Rehab   Meds ordered this encounter  Medications   HYDROcodone-acetaminophen (NORCO/VICODIN) 5-325 MG tablet    Sig: Take 1 tablet by mouth every 6 (six) hours as needed for moderate pain.    Dispense:  15 tablet    Refill:  0      Procedures: No procedures performed   Clinical Data: No additional findings.   Subjective: Chief Complaint  Patient presents with   Lower Back - Pain, Follow-up    MRI lumbar review    HPI 66 year old female returns with ongoing problems with back pain left leg pain that radiates to her thigh some portion of it radiates to her inner thigh.  She has some cervical spondylosis which we reviewed previously.  Review of Systems all systems updated unchanged from 04/28/2021 office visit.   Objective: Vital Signs: BP 116/70    Pulse 64    Ht 5\' 5"  (1.651 m)    Wt 131 lb (59.4 kg)    BMI 21.80 kg/m   Physical Exam Constitutional:      Appearance: She is well-developed.  HENT:     Head: Normocephalic.     Right Ear: External ear normal.     Left Ear: External ear normal. There is no impacted cerumen.  Eyes:     Pupils: Pupils are equal, round, and reactive to light.  Neck:     Thyroid: No thyromegaly.     Trachea: No tracheal deviation.  Cardiovascular:     Rate and Rhythm: Normal rate.  Pulmonary:     Effort: Pulmonary effort is normal.   Abdominal:     Palpations: Abdomen is soft.  Musculoskeletal:     Cervical back: No rigidity.  Skin:    General: Skin is warm and dry.  Neurological:     Mental Status: She is alert and oriented to person, place, and time.  Psychiatric:        Behavior: Behavior normal.    Ortho Exam patient has some pain with sac palpation on the left some pain with straight leg raising negative popliteal compression test.  Some tenderness of the left trochanter.  Abductor strong no quad weakness right or left.  Patient still has some brachial plexus tenderness both right and left.  Distal pulses are intact.  Sensory testing is normal.  Specialty Comments:  No specialty comments available.  Imaging: Narrative & Impression  CLINICAL DATA:  66 year old female with persistent low back pain. Left lower extremity radiculopathy. Not improving with conservative treatment.   EXAM: MRI LUMBAR SPINE WITHOUT CONTRAST   TECHNIQUE: Multiplanar, multisequence MR imaging of the lumbar spine was performed. No intravenous contrast was administered.   COMPARISON:  Lumbar radiographs 11/10/2020.   FINDINGS:  Segmentation:  Normal on the comparison.   Alignment: Stable lumbar lordosis. There is subtle dextroconvex lumbar scoliosis better demonstrated on the prior radiographs. No spondylolisthesis.   Vertebrae: No marrow edema or evidence of acute osseous abnormality. Visualized bone marrow signal is within normal limits. Intact visible sacrum.   Conus medullaris and cauda equina: Conus extends to the T12-L1 level. No lower spinal cord or conus signal abnormality. Fairly capacious spinal canal. Normal cauda equina nerve roots.   Paraspinal and other soft tissues: Negative.   Disc levels:   T11-T12: Negative.   T12-L1:  Negative.   L1-L2:  Negative.   L2-L3: Subtle disc desiccation and mild circumferential disc bulge eccentric to the right. Mild facet and ligament flavum hypertrophy mostly on  the right. No spinal or lateral recess stenosis. No foraminal stenosis.   L3-L4: Mild disc desiccation and circumferential disc bulge. Small left foraminal annular fissure of the disc (series 3, image 14). Mild facet and ligament flavum hypertrophy. No spinal or convincing lateral recess stenosis. Only mild left L3 foraminal stenosis.   L4-L5: Mild disc desiccation and circumferential disc bulge. Annular fissure of the disc again at the left foramen (series 3, image 14). Mild facet and ligament flavum hypertrophy. No spinal or lateral recess stenosis but mild foraminal stenosis and the exiting left L4 nerve appears inseparable from the disc on series 6, image 32.   L5-S1: Left far lateral broad-based disc osteophyte complex (series 6, image 39) also abutting the exiting left L5 nerve with up to mild left foraminal stenosis. No spinal or lateral recess stenosis.   IMPRESSION: 1. Multilevel left foraminal disc degeneration L3-L4 through L5-S1. At both the exiting left L3 and L4 nerve levels there are adjacent annular fissures of the underlying discs. And this is more pronounced at L4-L5. Query Left L4 radiculitis. 2. Otherwise lumbar disc bulging with mild posterior element hypertrophy but no spinal or lateral recess stenosis.     Electronically Signed   By: Odessa Fleming M.D.   On: 10/03/2021 05:55       PMFS History: Patient Active Problem List   Diagnosis Date Noted   Other spondylosis with radiculopathy, cervical region 04/28/2021   Radicular pain in left arm 03/04/2021   Pain in left hip 01/28/2021   Trochanteric bursitis, left hip 11/10/2020   Adhesive capsulitis of left shoulder 05/30/2018   Impingement syndrome of right shoulder 05/30/2018   Past Medical History:  Diagnosis Date   Anemia    Anxiety    Aortic atherosclerosis (HCC)    Asthma    Bipolar disorder (HCC)    COPD (chronic obstructive pulmonary disease) (HCC)    Depression    Gastritis    Gastroparesis     Hyperlipidemia    Irritable bowel syndrome with constipation    Osteoporosis    PTSD (post-traumatic stress disorder)    Sleep apnea    Vitamin D insufficiency     Family History  Problem Relation Age of Onset   Hyperlipidemia Mother    Hypertension Mother    Lung cancer Father    Colon cancer Neg Hx     Past Surgical History:  Procedure Laterality Date   ABDOMINAL HYSTERECTOMY  1998   EXCISION OF ABDOMINAL WALL TUMOR     Social History   Occupational History   Not on file  Tobacco Use   Smoking status: Former    Types: Cigarettes    Quit date: 07/30/2019    Years since quitting: 2.2  Smokeless tobacco: Never  Vaping Use   Vaping Use: Former  Substance and Sexual Activity   Alcohol use: Yes    Comment: wine every 2 months   Drug use: Never   Sexual activity: Not on file

## 2021-11-03 ENCOUNTER — Ambulatory Visit: Payer: Medicare Other | Admitting: Physical Medicine and Rehabilitation

## 2021-11-05 ENCOUNTER — Encounter: Payer: Self-pay | Admitting: Physical Medicine and Rehabilitation

## 2021-11-05 ENCOUNTER — Ambulatory Visit (INDEPENDENT_AMBULATORY_CARE_PROVIDER_SITE_OTHER): Payer: Medicare Other | Admitting: Physical Medicine and Rehabilitation

## 2021-11-05 ENCOUNTER — Ambulatory Visit: Payer: Self-pay

## 2021-11-05 ENCOUNTER — Other Ambulatory Visit: Payer: Self-pay

## 2021-11-05 VITALS — BP 138/82 | HR 82

## 2021-11-05 DIAGNOSIS — M5416 Radiculopathy, lumbar region: Secondary | ICD-10-CM | POA: Diagnosis not present

## 2021-11-05 MED ORDER — METHYLPREDNISOLONE ACETATE 80 MG/ML IJ SUSP
80.0000 mg | Freq: Once | INTRAMUSCULAR | Status: AC
  Administered 2021-11-05: 80 mg

## 2021-11-05 NOTE — Patient Instructions (Signed)

## 2021-11-05 NOTE — Progress Notes (Signed)
Leslie Schwartz - 66 y.o. female MRN [id number]  Date of birth: 1955/11/24  Office Visit Note: Visit Date: 11/05/2021 PCP: Dema Severin, NP Referred by: Dema Severin, NP  Subjective: Chief Complaint  Patient presents with   Lower Back - Pain   Left Hip - Pain   Left Thigh - Pain   HPI:  Leslie Schwartz is a 66 y.o. female who comes in today at the request of Dr. Annell Greening for planned Left L3-4 and L4-5 Lumbar Transforaminal epidural steroid injection with fluoroscopic guidance.  The patient has failed conservative care including home exercise, medications, time and activity modification.  This injection will be diagnostic and hopefully therapeutic.  Please see requesting physician notes for further details and justification. MRI reviewed with images and spine model.  MRI reviewed in the note below. Symptoms more L3 than L4 but a mix.   ROS Otherwise per HPI.  Assessment & Plan: Visit Diagnoses:    ICD-10-CM   1. Lumbar radiculopathy  M54.16 XR C-ARM NO REPORT    Epidural Steroid injection    methylPREDNISolone acetate (DEPO-MEDROL) injection 80 mg      Plan: No additional findings.   Meds & Orders:  Meds ordered this encounter  Medications   methylPREDNISolone acetate (DEPO-MEDROL) injection 80 mg    Orders Placed This Encounter  Procedures   XR C-ARM NO REPORT   Epidural Steroid injection    Follow-up: Return in about 2 weeks (around 11/19/2021) for Annell Greening, MD.   Procedures: No procedures performed  Lumbosacral Transforaminal Epidural Steroid Injection - Sub-Pedicular Approach with Fluoroscopic Guidance  Patient: Leslie Schwartz      Date of Birth: 1955/11/13 MRN: [id number] PCP: Dema Severin, NP      Visit Date: 11/05/2021   Universal Protocol:    Date/Time: 11/05/2021  Consent Given By: the patient  Position: PRONE  Additional Comments: Vital signs were monitored before and after the procedure. Patient was prepped and draped in the usual sterile  fashion. The correct patient, procedure, and site was verified.   Injection Procedure Details:   Procedure diagnoses: Lumbar radiculopathy [M54.16]    Meds Administered:  Meds ordered this encounter  Medications   methylPREDNISolone acetate (DEPO-MEDROL) injection 80 mg    Laterality: Left  Location/Site: L3 and L4  Needle:5.0 in., 22 ga.  Short bevel or Quincke spinal needle  Needle Placement: Transforaminal  Findings:    -Comments: Excellent flow of contrast along the nerve, nerve root and into the epidural space.  Procedure Details: After squaring off the end-plates to get a true AP view, the C-arm was positioned so that an oblique view of the foramen as noted above was visualized. The target area is just inferior to the "nose of the scotty dog" or sub pedicular. The soft tissues overlying this structure were infiltrated with 2-3 ml. of 1% Lidocaine without Epinephrine.  The spinal needle was inserted toward the target using a "trajectory" view along the fluoroscope beam.  Under AP and lateral visualization, the needle was advanced so it did not puncture dura and was located close the 6 O'Clock position of the pedical in AP tracterory. Biplanar projections were used to confirm position. Aspiration was confirmed to be negative for CSF and/or blood. A 1-2 ml. volume of Isovue-250 was injected and flow of contrast was noted at each level. Radiographs were obtained for documentation purposes.   After attaining the desired flow of contrast documented above, a 0.5 to 1.0 ml test dose of 0.25%  Marcaine was injected into each respective transforaminal space.  The patient was observed for 90 seconds post injection.  After no sensory deficits were reported, and normal lower extremity motor function was noted,   the above injectate was administered so that equal amounts of the injectate were placed at each foramen (level) into the transforaminal epidural space.   Additional Comments:  The  patient tolerated the procedure well Dressing: 2 x 2 sterile gauze and Band-Aid    Post-procedure details: Patient was observed during the procedure. Post-procedure instructions were reviewed.  Patient left the clinic in stable condition.    Clinical History: MRI LUMBAR SPINE WITHOUT CONTRAST   TECHNIQUE: Multiplanar, multisequence MR imaging of the lumbar spine was performed. No intravenous contrast was administered.   COMPARISON:  Lumbar radiographs 11/10/2020.   FINDINGS: Segmentation:  Normal on the comparison.   Alignment: Stable lumbar lordosis. There is subtle dextroconvex lumbar scoliosis better demonstrated on the prior radiographs. No spondylolisthesis.   Vertebrae: No marrow edema or evidence of acute osseous abnormality. Visualized bone marrow signal is within normal limits. Intact visible sacrum.   Conus medullaris and cauda equina: Conus extends to the T12-L1 level. No lower spinal cord or conus signal abnormality. Fairly capacious spinal canal. Normal cauda equina nerve roots.   Paraspinal and other soft tissues: Negative.   Disc levels:   T11-T12: Negative.   T12-L1:  Negative.   L1-L2:  Negative.   L2-L3: Subtle disc desiccation and mild circumferential disc bulge eccentric to the right. Mild facet and ligament flavum hypertrophy mostly on the right. No spinal or lateral recess stenosis. No foraminal stenosis.   L3-L4: Mild disc desiccation and circumferential disc bulge. Small left foraminal annular fissure of the disc (series 3, image 14). Mild facet and ligament flavum hypertrophy. No spinal or convincing lateral recess stenosis. Only mild left L3 foraminal stenosis.   L4-L5: Mild disc desiccation and circumferential disc bulge. Annular fissure of the disc again at the left foramen (series 3, image 14). Mild facet and ligament flavum hypertrophy. No spinal or lateral recess stenosis but mild foraminal stenosis and the exiting left  L4 nerve appears inseparable from the disc on series 6, image 32.   L5-S1: Left far lateral broad-based disc osteophyte complex (series 6, image 39) also abutting the exiting left L5 nerve with up to mild left foraminal stenosis. No spinal or lateral recess stenosis.   IMPRESSION: 1. Multilevel left foraminal disc degeneration L3-L4 through L5-S1. At both the exiting left L3 and L4 nerve levels there are adjacent annular fissures of the underlying discs. And this is more pronounced at L4-L5. Query Left L4 radiculitis. 2. Otherwise lumbar disc bulging with mild posterior element hypertrophy but no spinal or lateral recess stenosis.     Electronically Signed   By: Odessa Fleming M.D.   On: 10/03/2021 05:55     Objective:  VS:  HT:     WT:    BMI:      BP:138/82   HR:82bpm   TEMP: ( )   RESP:  Physical Exam Vitals and nursing note reviewed.  Constitutional:      General: She is not in acute distress.    Appearance: Normal appearance. She is not ill-appearing.  HENT:     Head: Normocephalic and atraumatic.     Right Ear: External ear normal.     Left Ear: External ear normal.  Eyes:     Extraocular Movements: Extraocular movements intact.  Cardiovascular:     Rate  and Rhythm: Normal rate.     Pulses: Normal pulses.  Pulmonary:     Effort: Pulmonary effort is normal. No respiratory distress.  Abdominal:     General: There is no distension.     Palpations: Abdomen is soft.  Musculoskeletal:        General: Tenderness present.     Cervical back: Neck supple.     Right lower leg: No edema.     Left lower leg: No edema.     Comments: Patient has good distal strength with no pain over the greater trochanters.  No clonus or focal weakness.  Skin:    Findings: No erythema, lesion or rash.  Neurological:     General: No focal deficit present.     Mental Status: She is alert and oriented to person, place, and time.     Sensory: No sensory deficit.     Motor: No weakness or abnormal  muscle tone.     Coordination: Coordination normal.  Psychiatric:        Mood and Affect: Mood normal.        Behavior: Behavior normal.     Imaging: No results found.

## 2021-11-05 NOTE — Procedures (Signed)
Lumbosacral Transforaminal Epidural Steroid Injection - Sub-Pedicular Approach with Fluoroscopic Guidance  Patient: Leslie Schwartz      Date of Birth: [birth year] MRN: [id number] PCP: Imagene Riches, NP      Visit Date: 11/05/2021   Universal Protocol:    Date/Time: 11/05/2021  Consent Given By: the patient  Position: PRONE  Additional Comments: Vital signs were monitored before and after the procedure. Patient was prepped and draped in the usual sterile fashion. The correct patient, procedure, and site was verified.   Injection Procedure Details:   Procedure diagnoses: Lumbar radiculopathy [M54.16]    Meds Administered:  Meds ordered this encounter  Medications   methylPREDNISolone acetate (DEPO-MEDROL) injection 80 mg    Laterality: Left  Location/Site: L3 and L4  Needle:5.0 in., 22 ga.  Short bevel or Quincke spinal needle  Needle Placement: Transforaminal  Findings:    -Comments: Excellent flow of contrast along the nerve, nerve root and into the epidural space.  Procedure Details: After squaring off the end-plates to get a true AP view, the C-arm was positioned so that an oblique view of the foramen as noted above was visualized. The target area is just inferior to the "nose of the scotty dog" or sub pedicular. The soft tissues overlying this structure were infiltrated with 2-3 ml. of 1% Lidocaine without Epinephrine.  The spinal needle was inserted toward the target using a "trajectory" view along the fluoroscope beam.  Under AP and lateral visualization, the needle was advanced so it did not puncture dura and was located close the 6 O'Clock position of the pedical in AP tracterory. Biplanar projections were used to confirm position. Aspiration was confirmed to be negative for CSF and/or blood. A 1-2 ml. volume of Isovue-250 was injected and flow of contrast was noted at each level. Radiographs were obtained for documentation purposes.   After attaining the desired  flow of contrast documented above, a 0.5 to 1.0 ml test dose of 0.25% Marcaine was injected into each respective transforaminal space.  The patient was observed for 90 seconds post injection.  After no sensory deficits were reported, and normal lower extremity motor function was noted,   the above injectate was administered so that equal amounts of the injectate were placed at each foramen (level) into the transforaminal epidural space.   Additional Comments:  The patient tolerated the procedure well Dressing: 2 x 2 sterile gauze and Band-Aid    Post-procedure details: Patient was observed during the procedure. Post-procedure instructions were reviewed.  Patient left the clinic in stable condition.

## 2021-11-05 NOTE — Progress Notes (Signed)
Pt state Lower back pain that travels to her left hip and inner thigh. Pt state laying down makes the pain worse. Pt state she takes pain meds to help ease her pain.  Numeric Pain Rating Scale and Functional Assessment Average Pain 3   In the last MONTH (on 0-10 scale) has pain interfered with the following?  1. General activity like being  able to carry out your everyday physical activities such as walking, climbing stairs, carrying groceries, or moving a chair?  Rating(9)   +Driver, -BT, -Dye Allergies.

## 2021-11-12 ENCOUNTER — Telehealth: Payer: Self-pay

## 2021-11-12 ENCOUNTER — Other Ambulatory Visit: Payer: Self-pay | Admitting: Physical Medicine and Rehabilitation

## 2021-11-12 MED ORDER — TRAMADOL HCL 50 MG PO TABS: 50.0000 mg | ORAL_TABLET | Freq: Three times a day (TID) | ORAL | 0 refills | Status: DC | PRN

## 2021-11-12 NOTE — Telephone Encounter (Signed)
Pt called into the office stating that she received back injections and she said Dr. Alvester Morin told her if they didn't help to call back so they could take a different route so she said she would like to make an appt to discuss.   Please advise

## 2021-11-12 NOTE — Progress Notes (Signed)
Spoke with patient via telephone this afternoon, states she continues to have pain after injection on 11/05/2021. Patient informed that it can take 1-2 weeks before she will begin seeing relief. Patient requested pain medication. I did prescribe short course of Tramadol for her today and instructed her to call us next week is her pain persists. At that point I feel we would need to see her in the office to discuss next steps of treatment plan.

## 2021-11-20 ENCOUNTER — Telehealth: Payer: Self-pay | Admitting: Physical Medicine and Rehabilitation

## 2021-11-20 NOTE — Telephone Encounter (Signed)
Patient called. She would like to speak with Dr. Alvester Morin or Aundra Millet. Her call back number is [phone]

## 2021-12-01 ENCOUNTER — Other Ambulatory Visit: Payer: Self-pay

## 2021-12-01 ENCOUNTER — Ambulatory Visit (INDEPENDENT_AMBULATORY_CARE_PROVIDER_SITE_OTHER): Payer: Medicare Other | Admitting: Orthopaedic Surgery

## 2021-12-01 ENCOUNTER — Encounter: Payer: Self-pay | Admitting: Orthopaedic Surgery

## 2021-12-01 VITALS — BP 106/71 | HR 69 | Ht 65.0 in | Wt 124.0 lb

## 2021-12-01 DIAGNOSIS — E041 Nontoxic single thyroid nodule: Secondary | ICD-10-CM | POA: Diagnosis not present

## 2021-12-01 DIAGNOSIS — M5416 Radiculopathy, lumbar region: Secondary | ICD-10-CM

## 2021-12-02 ENCOUNTER — Ambulatory Visit: Payer: Medicare Other | Admitting: Orthopaedic Surgery

## 2021-12-07 NOTE — Progress Notes (Signed)
Office Visit Note   Patient: Leslie Schwartz           Date of Birth: [birth year]           MRN: [id number] Visit Date: 12/01/2021              Requested by: Imagene Riches, NP [place],  [place] [id number] PCP: Imagene Riches, NP   Assessment & Plan: Visit Diagnoses:  1. Radiculopathy, lumbar region   2. Thyroid nodule     Plan: Patient had thyroid nodule on previous cervical MRI and needs a ultrasound of the thyroid for evaluation of 1.6 cm right thyroid nodule.  We will set up for repeat epidural with Dr. Ernestina Patches hopefully give her some relief.  She can follow-up with me after ultrasound and injection.  Follow-Up Instructions: No follow-ups on file.   Orders:  Orders Placed This Encounter  Procedures   US THYROID   Ambulatory referral to Physical Medicine Rehab   No orders of the defined types were placed in this encounter.     Procedures: No procedures performed   Clinical Data: No additional findings.   Subjective: Chief Complaint  Patient presents with   Lower Back - Pain    HPI 66 year old female returns with ongoing problems with back pain leg pain right and left legs are equal sometimes she states they feel like they lock up and the pain is in her groin.  Patient's had previous MRI scan which was normal of her hips.  Lumbar MRI 10/03/2021 showed some disc degeneration L3-4 through L5-S1 mild without significant compression.  At L4-5 the exiting L4 nerve root.  Immediately adjacent to the disc.  Patient has had injections in the past most recently 11/05/2021 but states that relief did not last.  Review of Systems 14 point system update unchanged.   Objective: Vital Signs: BP 106/71    Pulse 69    Ht 5\' 5"  (1.651 m)    Wt 124 lb (56.2 kg)    BMI 20.63 kg/m   Physical Exam Constitutional:      Appearance: She is well-developed.  HENT:     Head: Normocephalic.     Right Ear: External ear normal.     Left Ear: External ear normal. There is no impacted  cerumen.  Eyes:     Pupils: Pupils are equal, round, and reactive to light.  Neck:     Thyroid: No thyromegaly.     Trachea: No tracheal deviation.  Cardiovascular:     Rate and Rhythm: Normal rate.  Pulmonary:     Effort: Pulmonary effort is normal.  Abdominal:     Palpations: Abdomen is soft.  Musculoskeletal:     Cervical back: No rigidity.  Skin:    General: Skin is warm and dry.  Neurological:     Mental Status: She is alert and oriented to person, place, and time.  Psychiatric:        Behavior: Behavior normal.    Ortho Exam patient has some pain with sciatic notch palpation on the left.  Discomfort straight leg raising 90 degrees.  Negative popliteal compression test knee and ankle jerk are intact.  Anterior tib gastrocsoleus is strong.  Specialty Comments:  No specialty comments available.  Imaging:Study Result  Narrative & Impression  CLINICAL DATA:  66 year old female with persistent low back pain. Left lower extremity radiculopathy. Not improving with conservative treatment.   EXAM: MRI LUMBAR SPINE WITHOUT CONTRAST   TECHNIQUE: Multiplanar,  multisequence MR imaging of the lumbar spine was performed. No intravenous contrast was administered.   COMPARISON:  Lumbar radiographs 11/10/2020.   FINDINGS: Segmentation:  Normal on the comparison.   Alignment: Stable lumbar lordosis. There is subtle dextroconvex lumbar scoliosis better demonstrated on the prior radiographs. No spondylolisthesis.   Vertebrae: No marrow edema or evidence of acute osseous abnormality. Visualized bone marrow signal is within normal limits. Intact visible sacrum.   Conus medullaris and cauda equina: Conus extends to the T12-L1 level. No lower spinal cord or conus signal abnormality. Fairly capacious spinal canal. Normal cauda equina nerve roots.   Paraspinal and other soft tissues: Negative.   Disc levels:   T11-T12: Negative.   T12-L1:  Negative.   L1-L2:  Negative.    L2-L3: Subtle disc desiccation and mild circumferential disc bulge eccentric to the right. Mild facet and ligament flavum hypertrophy mostly on the right. No spinal or lateral recess stenosis. No foraminal stenosis.   L3-L4: Mild disc desiccation and circumferential disc bulge. Small left foraminal annular fissure of the disc (series 3, image 14). Mild facet and ligament flavum hypertrophy. No spinal or convincing lateral recess stenosis. Only mild left L3 foraminal stenosis.   L4-L5: Mild disc desiccation and circumferential disc bulge. Annular fissure of the disc again at the left foramen (series 3, image 14). Mild facet and ligament flavum hypertrophy. No spinal or lateral recess stenosis but mild foraminal stenosis and the exiting left L4 nerve appears inseparable from the disc on series 6, image 32.   L5-S1: Left far lateral broad-based disc osteophyte complex (series 6, image 39) also abutting the exiting left L5 nerve with up to mild left foraminal stenosis. No spinal or lateral recess stenosis.   IMPRESSION: 1. Multilevel left foraminal disc degeneration L3-L4 through L5-S1. At both the exiting left L3 and L4 nerve levels there are adjacent annular fissures of the underlying discs. And this is more pronounced at L4-L5. Query Left L4 radiculitis. 2. Otherwise lumbar disc bulging with mild posterior element hypertrophy but no spinal or lateral recess stenosis.     Electronically Signed   By: Odessa Fleming M.D.   On: 10/03/2021 05:55      PMFS History: Patient Active Problem List   Diagnosis Date Noted   Other spondylosis with radiculopathy, cervical region 04/28/2021   Radicular pain in left arm 03/04/2021   Pain in left hip 01/28/2021   Trochanteric bursitis, left hip 11/10/2020   Adhesive capsulitis of left shoulder 05/30/2018   Impingement syndrome of right shoulder 05/30/2018   Past Medical History:  Diagnosis Date   Anemia    Anxiety    Aortic atherosclerosis  (HCC)    Asthma    Bipolar disorder (HCC)    COPD (chronic obstructive pulmonary disease) (HCC)    Depression    Gastritis    Gastroparesis    Hyperlipidemia    Irritable bowel syndrome with constipation    Osteoporosis    PTSD (post-traumatic stress disorder)    Sleep apnea    Vitamin D insufficiency     Family History  Problem Relation Age of Onset   Hyperlipidemia Mother    Hypertension Mother    Lung cancer Father    Colon cancer Neg Hx     Past Surgical History:  Procedure Laterality Date   ABDOMINAL HYSTERECTOMY  1998   EXCISION OF ABDOMINAL WALL TUMOR     Social History   Occupational History   Not on file  Tobacco Use  Smoking status: Former    Types: Cigarettes    Quit date: 07/30/2019    Years since quitting: 2.3   Smokeless tobacco: Never  Vaping Use   Vaping Use: Former  Substance and Sexual Activity   Alcohol use: Yes    Comment: wine every 2 months   Drug use: Never   Sexual activity: Not on file

## 2021-12-09 ENCOUNTER — Ambulatory Visit
Admission: RE | Admit: 2021-12-09 | Discharge: 2021-12-09 | Disposition: A | Discharge status: Home / Self Care | Payer: Medicare Other | Source: Ambulatory Visit | Attending: Orthopaedic Surgery | Admitting: Orthopaedic Surgery

## 2021-12-09 DIAGNOSIS — E041 Nontoxic single thyroid nodule: Secondary | ICD-10-CM

## 2021-12-21 ENCOUNTER — Other Ambulatory Visit: Payer: Self-pay

## 2021-12-21 ENCOUNTER — Encounter: Payer: Self-pay | Admitting: Physical Medicine and Rehabilitation

## 2021-12-21 ENCOUNTER — Ambulatory Visit (INDEPENDENT_AMBULATORY_CARE_PROVIDER_SITE_OTHER): Payer: Medicare Other | Admitting: Physical Medicine and Rehabilitation

## 2021-12-21 ENCOUNTER — Ambulatory Visit: Payer: Self-pay

## 2021-12-21 VITALS — BP 122/89 | HR 70

## 2021-12-21 DIAGNOSIS — M5416 Radiculopathy, lumbar region: Secondary | ICD-10-CM | POA: Diagnosis not present

## 2021-12-21 MED ORDER — METHYLPREDNISOLONE ACETATE 80 MG/ML IJ SUSP
80.0000 mg | Freq: Once | INTRAMUSCULAR | Status: AC
  Administered 2021-12-21: 80 mg

## 2021-12-21 NOTE — Progress Notes (Signed)
Bilat L4, ?Pt state Lower back pain that travels to her both hips. Pt state laying down makes the pain worse. Pt state she takes pain meds to help ease her pain. Pt has hx of inj on 11/05/21 pt state it didn't help. ? ?Numeric Pain Rating Scale and Functional Assessment ?Average Pain 6 ? ? ?In the last MONTH (on 0-10 scale) has pain interfered with the following? ? ?1. General activity like being  able to carry out your everyday physical activities such as walking, climbing stairs, carrying groceries, or moving a chair?  ?Rating(10) ? ? ?+Driver, -BT, -Dye Allergies. ? ?

## 2021-12-21 NOTE — Patient Instructions (Signed)

## 2021-12-27 NOTE — Progress Notes (Signed)
? ?Leslie BarrierDonna Tangen - 66 y.o. female MRN [id number]  Date of birth: 09/01/1956 ? ?Office Visit Note: ?Visit Date: 12/21/2021 ?PCP: Dema SeverinYork, Regina F, NP ?Referred by: Dema SeverinYork, Regina F, NP ? ?Subjective: ?Chief Complaint  ?Patient presents with  ? Lower Back - Pain  ? Left Hip - Pain  ? Right Hip - Pain  ? ?HPI:  Leslie Schwartz is a 66 y.o. female who comes in today at the request of Dr. Annell GreeningMark Yates for planned Bilateral L4-5 Lumbar Transforaminal epidural steroid injection with fluoroscopic guidance.  The patient has failed conservative care including home exercise, medications, time and activity modification.  This injection will be diagnostic and hopefully therapeutic.  Please see requesting physician notes for further details and justification.  ? ?ROS Otherwise per HPI. ? ?Assessment & Plan: ?Visit Diagnoses:  ?  ICD-10-CM   ?1. Radiculopathy, lumbar region  M54.16 XR C-ARM NO REPORT  ?  Epidural Steroid injection  ?  methylPREDNISolone acetate (DEPO-MEDROL) injection 80 mg  ?  ?  ?Plan: No additional findings.  ? ?Meds & Orders:  ?Meds ordered this encounter  ?Medications  ? methylPREDNISolone acetate (DEPO-MEDROL) injection 80 mg  ?  ?Orders Placed This Encounter  ?Procedures  ? XR C-ARM NO REPORT  ? Epidural Steroid injection  ?  ?Follow-up: Return for visit to requesting provider as needed.  ? ?Procedures: ?No procedures performed  ?Lumbosacral Transforaminal Epidural Steroid Injection - Sub-Pedicular Approach with Fluoroscopic Guidance ? ?Patient: Leslie Schwartz      ?Date of Birth: 04/01/1956 ?MRN: [id number] ?PCP: Dema SeverinYork, Regina F, NP      ?Visit Date: 12/21/2021 ?  ?Universal Protocol:    ?Date/Time: 12/21/2021 ? ?Consent Given By: the patient ? ?Position: PRONE ? ?Additional Comments: ?Vital signs were monitored before and after the procedure. ?Patient was prepped and draped in the usual sterile fashion. ?The correct patient, procedure, and site was verified. ? ? ?Injection Procedure Details:  ? ?Procedure diagnoses:  Radiculopathy, lumbar region [M54.16]   ? ?Meds Administered:  ?Meds ordered this encounter  ?Medications  ? methylPREDNISolone acetate (DEPO-MEDROL) injection 80 mg  ? ? ?Laterality: Bilateral ? ?Location/Site: L4 ? ?Needle:5.0 in., 22 ga.  Short bevel or Quincke spinal needle ? ?Needle Placement: Transforaminal ? ?Findings: ?  ? -Comments: Excellent flow of contrast along the nerve, nerve root and into the epidural space. ? ?Procedure Details: ?After squaring off the end-plates to get a true AP view, the C-arm was positioned so that an oblique view of the foramen as noted above was visualized. The target area is just inferior to the "nose of the scotty dog" or sub pedicular. The soft tissues overlying this structure were infiltrated with 2-3 ml. of 1% Lidocaine without Epinephrine. ? ?The spinal needle was inserted toward the target using a "trajectory" view along the fluoroscope beam.  Under AP and lateral visualization, the needle was advanced so it did not puncture dura and was located close the 6 O'Clock position of the pedical in AP tracterory. Biplanar projections were used to confirm position. Aspiration was confirmed to be negative for CSF and/or blood. A 1-2 ml. volume of Isovue-250 was injected and flow of contrast was noted at each level. Radiographs were obtained for documentation purposes.  ? ?After attaining the desired flow of contrast documented above, a 0.5 to 1.0 ml test dose of 0.25% Marcaine was injected into each respective transforaminal space.  The patient was observed for 90 seconds post injection.  After no sensory deficits were reported, and normal  lower extremity motor function was noted,   the above injectate was administered so that equal amounts of the injectate were placed at each foramen (level) into the transforaminal epidural space. ? ? ?Additional Comments:  ?No complications occurred ?Dressing: 2 x 2 sterile gauze and Band-Aid ?  ? ?Post-procedure details: ?Patient was observed  during the procedure. ?Post-procedure instructions were reviewed. ? ?Patient left the clinic in stable condition. ?   ? ?Clinical History: ?MRI LUMBAR SPINE WITHOUT CONTRAST  ?   ?TECHNIQUE:  ?Multiplanar, multisequence MR imaging of the lumbar spine was  ?performed. No intravenous contrast was administered.  ?   ?COMPARISON:  Lumbar radiographs 11/10/2020.  ?   ?FINDINGS:  ?Segmentation:  Normal on the comparison.  ?   ?Alignment: Stable lumbar lordosis. There is subtle dextroconvex  ?lumbar scoliosis better demonstrated on the prior radiographs. No  ?spondylolisthesis.  ?   ?Vertebrae: No marrow edema or evidence of acute osseous abnormality.  ?Visualized bone marrow signal is within normal limits. Intact  ?visible sacrum.  ?   ?Conus medullaris and cauda equina: Conus extends to the T12-L1  ?level. No lower spinal cord or conus signal abnormality. Fairly  ?capacious spinal canal. Normal cauda equina nerve roots.  ?   ?Paraspinal and other soft tissues: Negative.  ?   ?Disc levels:  ?   ?T11-T12: Negative.  ?   ?T12-L1:  Negative.  ?   ?L1-L2:  Negative.  ?   ?L2-L3: Subtle disc desiccation and mild circumferential disc bulge  ?eccentric to the right. Mild facet and ligament flavum hypertrophy  ?mostly on the right. No spinal or lateral recess stenosis. No  ?foraminal stenosis.  ?   ?L3-L4: Mild disc desiccation and circumferential disc bulge. Small  ?left foraminal annular fissure of the disc (series 3, image 14).  ?Mild facet and ligament flavum hypertrophy. No spinal or convincing  ?lateral recess stenosis. Only mild left L3 foraminal stenosis.  ?   ?L4-L5: Mild disc desiccation and circumferential disc bulge. Annular  ?fissure of the disc again at the left foramen (series 3, image 14).  ?Mild facet and ligament flavum hypertrophy. No spinal or lateral  ?recess stenosis but mild foraminal stenosis and the exiting left L4  ?nerve appears inseparable from the disc on series 6, image 32.  ?   ?L5-S1: Left far  lateral broad-based disc osteophyte complex (series  ?6, image 39) also abutting the exiting left L5 nerve with up to mild  ?left foraminal stenosis. No spinal or lateral recess stenosis.  ?   ?IMPRESSION:  ?1. Multilevel left foraminal disc degeneration L3-L4 through L5-S1.  ?At both the exiting left L3 and L4 nerve levels there are adjacent  ?annular fissures of the underlying discs. And this is more  ?pronounced at L4-L5. Query Left L4 radiculitis.  ?2. Otherwise lumbar disc bulging with mild posterior element  ?hypertrophy but no spinal or lateral recess stenosis.  ?   ?   ?Electronically Signed  ?  By: Odessa Fleming M.D.  ?  On: 10/03/2021 05:55  ? ? ? ?Objective:  VS:  HT:    WT:   BMI:     BP:122/89  HR:70bpm  TEMP: ( )  RESP:  ?Physical Exam ?Vitals and nursing note reviewed.  ?Constitutional:   ?   General: She is not in acute distress. ?   Appearance: Normal appearance. She is not ill-appearing.  ?HENT:  ?   Head: Normocephalic and atraumatic.  ?   Right Ear: External  ear normal.  ?   Left Ear: External ear normal.  ?Eyes:  ?   Extraocular Movements: Extraocular movements intact.  ?Cardiovascular:  ?   Rate and Rhythm: Normal rate.  ?   Pulses: Normal pulses.  ?Pulmonary:  ?   Effort: Pulmonary effort is normal. No respiratory distress.  ?Abdominal:  ?   General: There is no distension.  ?   Palpations: Abdomen is soft.  ?Musculoskeletal:     ?   General: Tenderness present.  ?   Cervical back: Neck supple.  ?   Right lower leg: No edema.  ?   Left lower leg: No edema.  ?   Comments: Patient has good distal strength with no pain over the greater trochanters.  No clonus or focal weakness.  ?Skin: ?   Findings: No erythema, lesion or rash.  ?Neurological:  ?   General: No focal deficit present.  ?   Mental Status: She is alert and oriented to person, place, and time.  ?   Sensory: No sensory deficit.  ?   Motor: No weakness or abnormal muscle tone.  ?   Coordination: Coordination normal.  ?Psychiatric:     ?    Mood and Affect: Mood normal.     ?   Behavior: Behavior normal.  ?  ? ?Imaging: ?No results found. ?

## 2021-12-27 NOTE — Procedures (Signed)
Lumbosacral Transforaminal Epidural Steroid Injection - Sub-Pedicular Approach with Fluoroscopic Guidance ? ?Patient: Leslie Schwartz      ?Date of Birth: [birth year] ?MRN: [id number] ?PCP: Dema Severin, NP      ?Visit Date: 12/21/2021 ?  ?Universal Protocol:    ?Date/Time: 12/21/2021 ? ?Consent Given By: the patient ? ?Position: PRONE ? ?Additional Comments: ?Vital signs were monitored before and after the procedure. ?Patient was prepped and draped in the usual sterile fashion. ?The correct patient, procedure, and site was verified. ? ? ?Injection Procedure Details:  ? ?Procedure diagnoses: Radiculopathy, lumbar region [M54.16]   ? ?Meds Administered:  ?Meds ordered this encounter  ?Medications  ? methylPREDNISolone acetate (DEPO-MEDROL) injection 80 mg  ? ? ?Laterality: Bilateral ? ?Location/Site: L4 ? ?Needle:5.0 in., 22 ga.  Short bevel or Quincke spinal needle ? ?Needle Placement: Transforaminal ? ?Findings: ?  ? -Comments: Excellent flow of contrast along the nerve, nerve root and into the epidural space. ? ?Procedure Details: ?After squaring off the end-plates to get a true AP view, the C-arm was positioned so that an oblique view of the foramen as noted above was visualized. The target area is just inferior to the "nose of the scotty dog" or sub pedicular. The soft tissues overlying this structure were infiltrated with 2-3 ml. of 1% Lidocaine without Epinephrine. ? ?The spinal needle was inserted toward the target using a "trajectory" view along the fluoroscope beam.  Under AP and lateral visualization, the needle was advanced so it did not puncture dura and was located close the 6 O'Clock position of the pedical in AP tracterory. Biplanar projections were used to confirm position. Aspiration was confirmed to be negative for CSF and/or blood. A 1-2 ml. volume of Isovue-250 was injected and flow of contrast was noted at each level. Radiographs were obtained for documentation purposes.  ? ?After attaining the  desired flow of contrast documented above, a 0.5 to 1.0 ml test dose of 0.25% Marcaine was injected into each respective transforaminal space.  The patient was observed for 90 seconds post injection.  After no sensory deficits were reported, and normal lower extremity motor function was noted,   the above injectate was administered so that equal amounts of the injectate were placed at each foramen (level) into the transforaminal epidural space. ? ? ?Additional Comments:  ?No complications occurred ?Dressing: 2 x 2 sterile gauze and Band-Aid ?  ? ?Post-procedure details: ?Patient was observed during the procedure. ?Post-procedure instructions were reviewed. ? ?Patient left the clinic in stable condition. ?  ?

## 2022-01-18 DIAGNOSIS — R5383 Other fatigue: Secondary | ICD-10-CM | POA: Insufficient documentation

## 2022-01-18 DIAGNOSIS — R059 Cough, unspecified: Secondary | ICD-10-CM | POA: Insufficient documentation

## 2022-04-27 DIAGNOSIS — Z1329 Encounter for screening for other suspected endocrine disorder: Secondary | ICD-10-CM | POA: Insufficient documentation

## 2022-04-27 DIAGNOSIS — Z6821 Body mass index (BMI) 21.0-21.9, adult: Secondary | ICD-10-CM | POA: Insufficient documentation

## 2023-01-26 DIAGNOSIS — E785 Hyperlipidemia, unspecified: Secondary | ICD-10-CM | POA: Insufficient documentation

## 2023-01-26 DIAGNOSIS — F419 Anxiety disorder, unspecified: Secondary | ICD-10-CM | POA: Insufficient documentation

## 2023-01-26 DIAGNOSIS — M810 Age-related osteoporosis without current pathological fracture: Secondary | ICD-10-CM | POA: Insufficient documentation

## 2023-01-26 DIAGNOSIS — K581 Irritable bowel syndrome with constipation: Secondary | ICD-10-CM | POA: Insufficient documentation

## 2023-01-26 DIAGNOSIS — G4730 Sleep apnea, unspecified: Secondary | ICD-10-CM | POA: Insufficient documentation

## 2023-01-26 DIAGNOSIS — I700 Atherosclerosis of aorta: Secondary | ICD-10-CM | POA: Insufficient documentation

## 2023-01-26 DIAGNOSIS — F32A Depression, unspecified: Secondary | ICD-10-CM | POA: Insufficient documentation

## 2023-01-26 DIAGNOSIS — D649 Anemia, unspecified: Secondary | ICD-10-CM | POA: Insufficient documentation

## 2023-01-26 DIAGNOSIS — K3184 Gastroparesis: Secondary | ICD-10-CM | POA: Insufficient documentation

## 2023-01-26 DIAGNOSIS — F319 Bipolar disorder, unspecified: Secondary | ICD-10-CM | POA: Insufficient documentation

## 2023-01-26 DIAGNOSIS — F4310 Post-traumatic stress disorder, unspecified: Secondary | ICD-10-CM | POA: Insufficient documentation

## 2023-01-26 DIAGNOSIS — J439 Emphysema, unspecified: Secondary | ICD-10-CM | POA: Insufficient documentation

## 2023-01-26 DIAGNOSIS — J45909 Unspecified asthma, uncomplicated: Secondary | ICD-10-CM | POA: Insufficient documentation

## 2023-01-26 DIAGNOSIS — K2970 Gastritis, unspecified, without bleeding: Secondary | ICD-10-CM | POA: Insufficient documentation

## 2023-01-26 DIAGNOSIS — E559 Vitamin D deficiency, unspecified: Secondary | ICD-10-CM | POA: Insufficient documentation

## 2023-01-26 DIAGNOSIS — J449 Chronic obstructive pulmonary disease, unspecified: Secondary | ICD-10-CM | POA: Insufficient documentation

## 2023-02-01 NOTE — Progress Notes (Unsigned)
Cardiology Office Note:    Date:  02/02/2023  . ID:  Leslie Schwartz, DOB August 20, 1956, MRN [id number]  PCP:  Erskine Emery, NP  Cardiologist:  Norman Herrlich, MD   Referring MD: Erskine Emery, NP  ASSESSMENT:    1. Coronary artery calcification seen on CT scan   2. Aortic atherosclerosis   3. Mixed hyperlipidemia   4. Precordial pain    PLAN:    In order of problems listed above:  Typically coronary calcification on CT scan or aortic atherosclerosis or triggers initiate lipid-lowering therapy start rosuvastatin 10 mg daily recent lipid profile requested  She is symptomatic with exertional limiting shortness of breath possible anginal equivalent but may be related to chronic lung disease she has had a previous normal functional test myocardial perfusion would benefit from be scheduled for cardiac CTA Initiate lipid-lowering therapy  Next appointment 6 weeks.   Medication Adjustments/Labs and Tests Ordered: Current medicines are reviewed at length with the patient today.  Concerns regarding medicines are outlined above.  Orders Placed This Encounter  Procedures   CT CORONARY MORPH W/CTA COR W/SCORE W/CA W/CM &/OR WO/CM   Basic Metabolic Panel (BMET)   EKG 12-Lead   Meds ordered this encounter  Medications   rosuvastatin (CRESTOR) 10 MG tablet    Sig: Take 1 tablet (10 mg total) by mouth daily.    Dispense:  90 tablet    Refill:  3   metoprolol tartrate (LOPRESSOR) 100 MG tablet    Sig: Take 1 tablet (100 mg total) by mouth once for 1 dose. Please take this medication 2 hours before CT    Dispense:  1 tablet    Refill:  0      Chief complaint I was told to see cardiology after CT of my chest  History of Present Illness:    Leslie Schwartz is a 67 y.o. female with a history of COPD and previous costochondral chest pain who is being seen today at the patient's request  She was previously seen by me January 2021 with chest pain felt to be atypical costochondral pain  syndrome.  She also had thoracic aortic atherosclerosis.  Previously she had undergone myocardial perfusion study December 2020 which was normal no ischemia or pattern of infarction normal perfusion and normal function ejection fraction 60%.  CT of the chest lung cancer screening 12/29/2022 showing aortic atherosclerosis and coronary artery plaque in the LAD and left circumflex as well as findings of emphysema chronic bronchitis She is told to see cardiology In the interim she has had a respiratory infection and has wheezing and productive sputum Previous activities that were easier now difficult she is short of breath when she climbs stairs walks the dog and when she exercises she has to stop because and the rest.  There has been no chest discomfort associated with the no edema orthopnea or syncope. She is not on lipid-lowering treatment Past Medical History:  Diagnosis Date   Anemia    Anxiety    Aortic atherosclerosis    Asthma    Bipolar disorder    COPD (chronic obstructive pulmonary disease)    Depression    Gastritis    Gastroparesis    Hyperlipidemia    Irritable bowel syndrome with constipation    Osteoporosis    PTSD (post-traumatic stress disorder)    Sleep apnea    Vitamin D insufficiency     Past Surgical History:  Procedure Laterality Date   ABDOMINAL HYSTERECTOMY  1998  EXCISION OF ABDOMINAL WALL TUMOR      Current Medications: Current Meds  Medication Sig   Albuterol Sulfate (PROAIR HFA IN) Inhale 1 puff into the lungs as needed (Shortness of breath / Wheezing).   celecoxib (CELEBREX) 100 MG capsule Take 100 mg by mouth 2 (two) times daily.   clonazePAM (KLONOPIN) 1 MG tablet TK 1 T PO TID   lamoTRIgine (LAMICTAL) 100 MG tablet Take 100 mg by mouth at bedtime.   metoprolol tartrate (LOPRESSOR) 100 MG tablet Take 1 tablet (100 mg total) by mouth once for 1 dose. Please take this medication 2 hours before CT   ondansetron (ZOFRAN-ODT) 4 MG disintegrating tablet  Take 1-2 tablets (4-8 mg total) by mouth 2 (two) times daily.   raloxifene (EVISTA) 60 MG tablet Take 60 mg by mouth daily.   rosuvastatin (CRESTOR) 10 MG tablet Take 1 tablet (10 mg total) by mouth daily.   tiZANidine (ZANAFLEX) 4 MG tablet Take 4 mg by mouth every 8 (eight) hours.   TRINTELLIX 10 MG TABS tablet Take 20 mg by mouth daily.    [DISCONTINUED] rosuvastatin (CRESTOR) 5 MG tablet Take 5 mg by mouth daily.   Current Facility-Administered Medications for the 02/02/23 encounter (Office Visit) with Baldo Daub, MD  Medication   regadenoson (LEXISCAN) injection SOLN 0.4 mg     Allergies:   Reglan [metoclopramide], Macrolides and ketolides, and Sulfa antibiotics   Social History   Socioeconomic History   Marital status: Divorced    Spouse name: Not on file   Number of children: Not on file   Years of education: Not on file   Highest education level: Not on file  Occupational History   Not on file  Tobacco Use   Smoking status: Former    Types: Cigarettes    Quit date: 07/30/2019    Years since quitting: 3.5   Smokeless tobacco: Never  Vaping Use   Vaping Use: Former  Substance and Sexual Activity   Alcohol use: Yes    Comment: wine every 2 months   Drug use: Never   Sexual activity: Not on file  Other Topics Concern   Not on file  Social History Narrative   Not on file   Social Determinants of Health   Financial Resource Strain: Not on file  Food Insecurity: Not on file  Transportation Needs: Not on file  Physical Activity: Not on file  Stress: Not on file  Social Connections: Not on file     Family History: The patient's family history includes Hyperlipidemia in her mother; Hypertension in her mother; Lung cancer in her father. There is no history of Colon cancer.  ROS:   ROS Please see the history of present illness.     All other systems reviewed and are negative.  EKGs/Labs/Other Studies Reviewed:    The following studies were reviewed  today:   Cardiac Studies & Procedures     STRESS TESTS  MYOCARDIAL PERFUSION IMAGING 09/26/2019  Narrative  The left ventricular ejection fraction is normal (55-65%).  Nuclear stress EF: 55%.  There was no ST segment deviation noted during stress.  This is a low risk study.  No evidence of ischemia or MI.  Normal LVEF.              EKG:  EKG is sinus rhythm and is normal ordered today.  The ekg ordered today is personally reviewed and demonstrates sinus rhythm and normal  Recent Labs: Requested from her PCP  Physical  Exam:    VS:  BP 110/78 (BP Location: Left Arm, Patient Position: Sitting, Cuff Size: Small)   Pulse 68   Ht 5\' 5"  (1.651 m)   Wt 118 lb (53.5 kg)   SpO2 97%   BMI 19.64 kg/m     Wt Readings from Last 3 Encounters:  02/02/23 118 lb (53.5 kg)  12/01/21 124 lb (56.2 kg)  10/09/21 131 lb (59.4 kg)     GEN:  Well nourished, well developed in no acute distress HEENT: Normal NECK: No JVD; No carotid bruits LYMPHATICS: No lymphadenopathy CARDIAC: RRR, no murmurs, rubs, gallops RESPIRATORY:  Clear to auscultation without rales, wheezing or rhonchi  ABDOMEN: Soft, non-tender, non-distended MUSCULOSKELETAL:  No edema; No deformity  SKIN: Warm and dry NEUROLOGIC:  Alert and oriented x 3 PSYCHIATRIC:  Normal affect     Signed, Norman Herrlich, MD  02/02/2023 11:52 AM    [facility]

## 2023-02-02 ENCOUNTER — Ambulatory Visit: Payer: 59 | Attending: Cardiology | Admitting: Cardiology

## 2023-02-02 ENCOUNTER — Encounter: Payer: Self-pay | Admitting: Cardiology

## 2023-02-02 VITALS — BP 110/78 | HR 68 | Ht 65.0 in | Wt 118.0 lb

## 2023-02-02 DIAGNOSIS — E782 Mixed hyperlipidemia: Secondary | ICD-10-CM | POA: Diagnosis not present

## 2023-02-02 DIAGNOSIS — I700 Atherosclerosis of aorta: Secondary | ICD-10-CM | POA: Diagnosis not present

## 2023-02-02 DIAGNOSIS — R072 Precordial pain: Secondary | ICD-10-CM | POA: Diagnosis not present

## 2023-02-02 DIAGNOSIS — I2510 Atherosclerotic heart disease of native coronary artery without angina pectoris: Secondary | ICD-10-CM

## 2023-02-02 MED ORDER — METOPROLOL TARTRATE 100 MG PO TABS: 100.0000 mg | ORAL_TABLET | Freq: Once | ORAL | 0 refills | Status: DC

## 2023-02-02 MED ORDER — ROSUVASTATIN CALCIUM 10 MG PO TABS: 10.0000 mg | ORAL_TABLET | Freq: Every day | ORAL | 3 refills | Status: AC

## 2023-02-02 NOTE — Patient Instructions (Signed)
Medication Instructions:  Your physician has recommended you make the following change in your medication:   START: Rosuvastatin 10 mg daily  *If you need a refill on your cardiac medications before your next appointment, please call your pharmacy*   Lab Work: Your physician recommends that you return for lab work in:   Labs 1 week before CT: BMP  If you have labs (blood work) drawn today and your tests are completely normal, you will receive your results only by: MyChart Message (if you have MyChart) OR A paper copy in the mail If you have any lab test that is abnormal or we need to change your treatment, we will call you to review the results.   Testing/Procedures:   Your cardiac CT will be scheduled at one of the below locations:   [facility] [address] [place], [place] [address] [phone]  OR  [facility] [address] [place], [place] [address] [phone]  OR   [facility] [address] [place], [place] [address] [phone]  If scheduled at [facility], please arrive at the [facility]'s Entrance (Entrance C2) of [facility] 30 minutes prior to test start time. You can use the FREE valet parking offered at entrance C (encouraged to control the heart rate for the test)  Proceed to the [facility] Radiology Department (first floor) to check-in and test prep.  All radiology patients and guests should use entrance C2 at [facility], accessed from [facility], even though the hospital's physical address listed is [address]    If scheduled at [facility] or [facility], please arrive 15 mins early for check-in and test prep.   Please follow these instructions carefully (unless otherwise directed):  On the Night Before the Test: Be sure to Drink plenty of  water. Do not consume any caffeinated/decaffeinated beverages or chocolate 12 hours prior to your test. Do not take any antihistamines 12 hours prior to your test.  On the Day of the Test: Drink plenty of water until 1 hour prior to the test. Do not eat any food 1 hour prior to test. You may take your regular medications prior to the test.  Take metoprolol (Lopressor) two hours prior to test. FEMALES- please wear underwire-free bra if available, avoid dresses & tight clothing      After the Test: Drink plenty of water. After receiving IV contrast, you may experience a mild flushed feeling. This is normal. On occasion, you may experience a mild rash up to 24 hours after the test. This is not dangerous. If this occurs, you can take Benadryl 25 mg and increase your fluid intake. If you experience trouble breathing, this can be serious. If it is severe call 911 IMMEDIATELY. If it is mild, please call our office. If you take any of these medications: Glipizide/Metformin, Avandament, Glucavance, please do not take 48 hours after completing test unless otherwise instructed.  We will call to schedule your test 2-4 weeks out understanding that some insurance companies will need an authorization prior to the service being performed.   For non-scheduling related questions, please contact the cardiac imaging nurse navigator should you have any questions/concerns: Rockwell Alexandria, Cardiac Imaging Nurse Navigator [name], Cardiac Imaging Nurse Navigator [facility] Direct Office Dial: [phone]   For scheduling needs, including cancellations and rescheduling, please call [place], [phone].  Follow-Up: At [facility], you and your health needs are our priority.  As part of our continuing mission to provide you with exceptional heart care, we have created designated Provider Care Teams.  These Care Teams include your primary Cardiologist (physician)  and Advanced Practice Providers (APPs -  Physician Assistants and Nurse Practitioners) who all work together to provide you with the care you need, when you need it.  We recommend signing up for the patient portal called "MyChart".  Sign up information is provided on this After Visit Summary.  MyChart is used to connect with patients for Virtual Visits (Telemedicine).  Patients are able to view lab/test results, encounter notes, upcoming appointments, etc.  Non-urgent messages can be sent to your provider as well.   To learn more about what you can do with MyChart, go to [email or web address].    Your next appointment:   3 month(s)  Provider:   Norman Herrlich, MD    Other Instructions None

## 2023-02-04 LAB — BASIC METABOLIC PANEL
BUN/Creatinine Ratio: 6 — ABNORMAL LOW (ref 12–28)
BUN: 7 mg/dL — ABNORMAL LOW (ref 8–27)
CO2: 24 mmol/L (ref 20–29)
Calcium: 9.7 mg/dL (ref 8.7–10.3)
Chloride: 102 mmol/L (ref 96–106)
Creatinine, Ser: 1.09 mg/dL — ABNORMAL HIGH (ref 0.57–1.00)
Glucose: 87 mg/dL (ref 70–99)
Potassium: 4.5 mmol/L (ref 3.5–5.2)
Sodium: 140 mmol/L (ref 134–144)
eGFR: 57 mL/min/{1.73_m2} — ABNORMAL LOW (ref >59)

## 2023-02-07 ENCOUNTER — Telehealth: Payer: Self-pay | Admitting: Cardiology

## 2023-02-07 NOTE — Telephone Encounter (Signed)
Pt returning call for lab results  

## 2023-02-07 NOTE — Telephone Encounter (Signed)
Patient informed of results.  

## 2023-02-08 ENCOUNTER — Telehealth (HOSPITAL_COMMUNITY): Payer: Self-pay | Admitting: *Deleted

## 2023-02-08 NOTE — Telephone Encounter (Signed)
Reaching out to patient to offer assistance regarding upcoming cardiac imaging study; pt verbalizes understanding of appt date/time, parking situation and where to check in, pre-test NPO status and medications ordered, and verified current allergies; name and call back number provided for further questions should they arise  [name] RN Navigator Cardiac Imaging [facility] [phone] office [phone] cell  Patient to take 100mg  metoprolol tartrate two hours prior to her cardiac CT scan.  She is aware to arrive at 1:30am.

## 2023-02-09 ENCOUNTER — Ambulatory Visit (HOSPITAL_COMMUNITY): Admission: RE | Admit: 2023-02-09 | Payer: 59 | Source: Ambulatory Visit

## 2023-02-09 ENCOUNTER — Telehealth: Payer: Self-pay | Admitting: Cardiology

## 2023-02-09 NOTE — Telephone Encounter (Signed)
Patient is calling because she had a CT Cardiac Morph scheduled for today. Patient would like Dr. Dulce Sellar to know that she cancelled her appt because of the Copay amount.

## 2023-02-09 NOTE — Telephone Encounter (Signed)
Attempted to call patient. Patient did not answer the phone and voice mail is full so unable to leave a message. 

## 2023-02-10 ENCOUNTER — Telehealth: Payer: Self-pay | Admitting: Cardiology

## 2023-02-10 NOTE — Telephone Encounter (Signed)
Patient is calling because she would like to know if she is able to have her CT Cardiac Morph done at [facility] instead. Please advise.

## 2023-02-11 NOTE — Telephone Encounter (Signed)
Called patient and she stated that she did not want to have her cardiac CT at [facility] due to the large co-pay. She stated that she would rather have her cardiac CT performed at [facility]. I explained to her that it would need to be pre-certified with her insurance again. She stated that would be fine. I sent her information to pre-cert again. Patient had no further questions at this time.

## 2023-02-25 ENCOUNTER — Encounter: Payer: Self-pay | Admitting: Cardiology

## 2023-03-02 ENCOUNTER — Telehealth: Payer: Self-pay

## 2023-03-02 ENCOUNTER — Telehealth: Payer: Self-pay | Admitting: Cardiology

## 2023-03-02 NOTE — Telephone Encounter (Signed)
Left message on My Chart with normal results per Dr. Krasowski's note 

## 2023-03-02 NOTE — Telephone Encounter (Signed)
Received the following message from Dr. Dulce Sellar below, regarding the patient's coronary CTA results:  "Mild CAD not restricting blood flow does not need heart catheterization or stent and should remain on her statin."  Patient informed of Dr. Hulen Shouts interpretation of her coronary CT. Patient has no further questions at this time.

## 2023-03-02 NOTE — Telephone Encounter (Signed)
Patient is requesting a call back to discuss CT results. 

## 2023-03-03 ENCOUNTER — Encounter: Payer: Self-pay | Admitting: Cardiology

## 2023-03-08 ENCOUNTER — Encounter: Payer: Self-pay | Admitting: Physician Assistant

## 2023-03-11 DIAGNOSIS — R634 Abnormal weight loss: Secondary | ICD-10-CM | POA: Insufficient documentation

## 2023-05-25 NOTE — Progress Notes (Unsigned)
05/26/2023 Leslie Schwartz [id number] 03/17/1956  Referring provider: Erskine Emery, NP Primary GI doctor: Dr. Rhea Belton  ASSESSMENT AND PLAN:   67 year old female with history of cyclical nausea and vomiting previous workup 2020 with CT, barium swallow EGD which were unremarkable. Gastric emptying study 2021 showed 84% after 4 hours Now presents with worsening nausea and vomiting, some weight loss which she had recovered.   Patient denies dysphagia, abdominal pain, GERD. Describes eating breakfast with vomiting afterwards does well with liquids. Some concern for possible worsening gastroparesis versus wanting to rule out obstruction with her current symptoms, will schedule for upper GI Patient is not tolerant to Reglan had seizure prior, Zofran is no longer working.  -Weight stable at this time, continue gastroparesis diet. -With some concern for global paresis with constipation as well given patient samples of Motegrity. -I do not believe patient needs repeat endoscopic evaluation with current symptoms but pending course or pending Dr. Lauro Franklin opinion could consider endoscopic evaluation. -With symptoms every 2 to 3 days, worse in the morning and with eating some concern still for cyclic vomiting syndrome -Patient states that if it is her gastroparesis she wants referral to tertiary center for surgery for it, stated depending on what we find could potentially referred to a motility specialist center, she does not want [facility].  Irritable bowel syndrome with constipation Given Motegrity samples Has failed MiraLAX  Special screening for malignant neoplasms, colon Patient had last colonoscopy 2014 recall 2024 Long discussion with the patient about cologuard versus colonoscopy without family history, no personal history of polyps and no symptoms at this time will schedule for cologuard, discussed false positives with patient and she understands risk of that and possibly still needing  a colonoscopy in the future.   Patient Care Team: Erskine Emery, NP as PCP - General  HISTORY OF PRESENT ILLNESS: 67 y.o. female with a past medical history of seizure disorder, constipation, negative H. pylori gastritis, vomiting and others listed below presents for evaluation of gastroparesis.    She has had an abdominal and pelvic CT scan in July 2020, barium swallow July 2020, EGD August 2020 which revealed mild gastritis negative for H. pylori.   Prior normal colonoscopy in 2014, recall 2024.   And a normal chest x-ray.  2021 office visit with Dr. Rhea Belton for recurrent vomiting, gastritis, constipation Thought to be gastric emptying versus cyclic vomiting syndrome. 01/17/2020 gastric emptying study showed delayed gastric emptying with 82% emptied at 4 hours with normal being greater than 90%.  01/2023 office visit with Dr. Dulce Sellar cardiology for chest discomfort, started on ROUVASTATIN, CTA was done in [facility] May 16th, per [facility] "Mild CAD not restricting blood flow does not need heart catheterization or stent and should remain on her statin."   She has had nausea for last 3 years but has gotten worse in the last 6 months. Zofran was controlling it but now she is having to take 3-4 a day.  She had nausea this AM. She is vomiting 2-3 x a week, one morning it work her from her sleep. Usually more in the morning, and with the act of eating breakfast and the foods/coffee comes up.  Her stomach growls a lot, denies early satiety. She states she has increase gas. She states nothing is appealing with food.  No dysphagia. No AB pain, eats small meals. Eats about 1 meal a day.  She has had constipation and uses clear lax but this also has not been working,  will have small BM every 2-3 days, does not have complete Bm's.  No melena, no hematochezia.  She states she lost all of her teeth, had to have extraction and dentures due to the acid. No reflux or GERD  She states she did get down to 90 lbs  in last several months but increased protein shakes and has her weight back up to 111.  She had tried reglan in the past that causes seizures.  Stays cold lately, no fever.  She states she does not want another colon, has not found anything prior, no family history.   Wt Readings from Last 5 Encounters:  05/26/23 111 lb 2 oz (50.4 kg)  02/02/23 118 lb (53.5 kg)  12/01/21 124 lb (56.2 kg)  10/09/21 131 lb (59.4 kg)  09/10/21 131 lb (59.4 kg)    She denies blood thinner use.  She denies NSAID use.  She denies ETOH use.   She denies tobacco use.  She denies drug use.    She  reports that she quit smoking about 3 years ago. Her smoking use included cigarettes. She has never used smokeless tobacco. She reports current alcohol use. She reports that she does not use drugs.  RELEVANT LABS AND IMAGING:  CMP     Component Value Date/Time   NA 140 02/03/2023 0955   K 4.5 02/03/2023 0955   CL 102 02/03/2023 0955   CO2 24 02/03/2023 0955   GLUCOSE 87 02/03/2023 0955   BUN 7 (L) 02/03/2023 0955   CREATININE 1.09 (H) 02/03/2023 0955   CALCIUM 9.7 02/03/2023 0955       No data to display            Current Medications:   Current Outpatient Medications (Endocrine & Metabolic):    raloxifene (EVISTA) 60 MG tablet, Take 60 mg by mouth daily.   Current Outpatient Medications (Cardiovascular):    rosuvastatin (CRESTOR) 10 MG tablet, Take 1 tablet (10 mg total) by mouth daily.   metoprolol tartrate (LOPRESSOR) 100 MG tablet, Take 1 tablet (100 mg total) by mouth once for 1 dose. Please take this medication 2 hours before CT   Current Outpatient Medications (Respiratory):    Albuterol Sulfate (PROAIR HFA IN), Inhale 1 puff into the lungs as needed (Shortness of breath / Wheezing).   Current Outpatient Medications (Analgesics):    celecoxib (CELEBREX) 100 MG capsule, Take 100 mg by mouth 2 (two) times daily.     Current Outpatient Medications (Other):     Calcium-Magnesium-Vitamin D (CALCIUM 1200+D3 PO), Take 1 tablet by mouth daily.   clonazePAM (KLONOPIN) 1 MG tablet, TK 1 T PO TID   lamoTRIgine (LAMICTAL) 100 MG tablet, Take 100 mg by mouth at bedtime.   levETIRAcetam (KEPPRA) 500 MG tablet, Take 500 mg by mouth 2 (two) times daily.   Multiple Vitamins-Minerals (MULTIVITAMIN WOMEN 50+ PO), Take 1 tablet by mouth daily.   ondansetron (ZOFRAN-ODT) 4 MG disintegrating tablet, Take 1-2 tablets (4-8 mg total) by mouth 2 (two) times daily.   tiZANidine (ZANAFLEX) 4 MG tablet, Take 4 mg by mouth every 8 (eight) hours.   TRINTELLIX 10 MG TABS tablet, Take 20 mg by mouth daily.   Current Facility-Administered Medications (Other):    regadenoson (LEXISCAN) injection SOLN 0.4 mg  Medical History:  Past Medical History:  Diagnosis Date   Anemia    Anxiety    Aortic atherosclerosis (HCC)    Asthma    Bipolar disorder (HCC)    COPD (chronic obstructive  pulmonary disease) (HCC)    Depression    Gastritis    Gastroparesis    Hyperlipidemia    Irritable bowel syndrome with constipation    Osteoporosis    PTSD (post-traumatic stress disorder)    Sleep apnea    Vitamin D insufficiency    Allergies:  Allergies  Allergen Reactions   Reglan [Metoclopramide] Other (See Comments)    Seizures    Macrolides And Ketolides     Patient CAN take azithyromycin and erythromycin per Willette Cluster, PA-C 01/14/20   Sulfa Antibiotics Itching     Surgical History:  She  has a past surgical history that includes Abdominal hysterectomy (1998) and Excision of abdominal wall tumor. Family History:  Her family history includes Hyperlipidemia in her mother; Hypertension in her mother; Lung cancer in her father.  REVIEW OF SYSTEMS  : All other systems reviewed and negative except where noted in the History of Present Illness.  PHYSICAL EXAM: BP 108/64   Pulse 64   Ht 5\' 5"  (1.651 m)   Wt 111 lb 2 oz (50.4 kg)   SpO2 95%   BMI 18.49 kg/m  General  Appearance: Thin appearing, in no apparent distress. Head:   Normocephalic and atraumatic. Eyes:  sclerae anicteric,conjunctive pink  Respiratory: Respiratory effort normal, BS equal bilaterally without rales, rhonchi, wheezing. Cardio: RRR with no MRGs. Peripheral pulses intact.  Abdomen: Soft,  Flat ,active bowel sounds. No tenderness . Marland Kitchen No masses. Rectal: Not evaluated Musculoskeletal: Full ROM, Normal gait. Without edema. Skin:  Dry and intact without significant lesions or rashes Neuro: Alert and  oriented x4;  No focal deficits. Psych:  Cooperative. Normal mood and affect.    Doree Albee, PA-C 3:03 PM

## 2023-05-26 ENCOUNTER — Encounter: Payer: Self-pay | Admitting: Physician Assistant

## 2023-05-26 ENCOUNTER — Ambulatory Visit (INDEPENDENT_AMBULATORY_CARE_PROVIDER_SITE_OTHER): Payer: 59 | Admitting: Physician Assistant

## 2023-05-26 VITALS — BP 108/64 | HR 64 | Ht 65.0 in | Wt 111.1 lb

## 2023-05-26 DIAGNOSIS — R634 Abnormal weight loss: Secondary | ICD-10-CM

## 2023-05-26 DIAGNOSIS — K3184 Gastroparesis: Secondary | ICD-10-CM | POA: Diagnosis not present

## 2023-05-26 DIAGNOSIS — R112 Nausea with vomiting, unspecified: Secondary | ICD-10-CM

## 2023-05-26 DIAGNOSIS — K581 Irritable bowel syndrome with constipation: Secondary | ICD-10-CM

## 2023-05-26 DIAGNOSIS — Z1211 Encounter for screening for malignant neoplasm of colon: Secondary | ICD-10-CM

## 2023-05-26 NOTE — Patient Instructions (Addendum)
You have been scheduled for an Upper GI Series at [facility] . Your appointment is on 06/16/23 at 9:00 am . Please arrive 30 minutes prior to your test for registration. Make sure not to eat or drink anything after midnight on the night before your test. If you need to reschedule, please call radiology at [phone]. ________________________________________________________________ An upper GI series uses x rays to help diagnose problems of the upper GI tract, which includes the esophagus, stomach, and duodenum. The duodenum is the first part of the small intestine. An upper GI series is conducted by a radiology technologist or a radiologist--a doctor who specializes in x-ray imaging--at a hospital or outpatient center. While sitting or standing in front of an x-ray machine, the patient drinks barium liquid, which is often white and has a chalky consistency and taste. The barium liquid coats the lining of the upper GI tract and makes signs of disease show up more clearly on x rays. X-ray video, called fluoroscopy, is used to view the barium liquid moving through the esophagus, stomach, and duodenum. Additional x rays and fluoroscopy are performed while the patient lies on an x-ray table. To fully coat the upper GI tract with barium liquid, the technologist or radiologist may press on the abdomen or ask the patient to change position. Patients hold still in various positions, allowing the technologist or radiologist to take x rays of the upper GI tract at different angles. If a technologist conducts the upper GI series, a radiologist will later examine the images to look for problems.  This test typically takes about 1 hour to complete. __________________________________________________________________    We may want to evaluate you for small intestinal bacterial overgrowth, this can cause increase gas, bloating, loose stools or constipation.  There is a test for this we can do or sometimes we  will treat a patient with an antibiotic to see if it helps.     We have given you samples of the following medication to take: Motegrity- Take 1 pill by mouth once daily. ( For constipation and gastroparesis)   COLOGUARD INFORMATION  Colon cancer is 3rd most diagnosed cancer and 2nd leading cause of death in both men and women 65 years of age and older despite being one of the most preventable and treatable cancers if found early.  4 of out 5 people diagnosed with colon cancer have NO prior family history.  When caught EARLY 90% of colon cancer is curable.   More than 92% of cologuard patients have NO out of pocket cost for screening however only your insurer can confirm how Cologuard would be covered for you.  [facility]ologuard has a team of specialist that can help you contact your insurer and ask the right questions.  PRIOR to completing the test, it is YOUR responsibility to contact your insurance about covered benefits for this test. Your out of pocket expense could be anywhere from $0.00 to $649.00.  Please call [phone] so they can help.   When you call to check coverage with your insurer, please provide the following information:   -The ONLY provider of Cologuard is Optician, dispensing  - CPT code for Cologuard is [phone].  [facility] NPI # [id number]  -[employer] Tax ID # [id number]   We have already sent your demographic and insurance information to [employer] (phone number [phone]) and they should contact you within the next week regarding your test. If you have not heard from them within the next week,  please call our office at [phone].  You will receive a short call from [employer] Customer support center at [employer], when you receive a call they will say they are from [employer],  to confirm your mailing address and give you more information.  When they calll you, it will appear on the caller ID as "[employer]"  or in some cases only this number will appear, [phone].   [employer] will ship your collection kit directly to you. You will collect a single stool sample in the privacy of your own home, no special preparation required. You will return the kit via UPS pre-paid shipping or pick-up, in the same box it arrived in. Then I will contact you to discuss your results after I receive them from the laboratory.   If you have any questions or concerns, [facility] Customer Support Specialist are available 24 hours a day, 7 days a week at [phone] or go to [email or web address]   Gastroparesis Please do small frequent meals like 4-6 meals a day.  Eat and drink liquids at separate times.  Avoid high fiber foods, cook your vegetables, avoid high fat food.  Suggest spreading protein throughout the day (greek yogurt, glucerna, soft meat, milk, eggs) Choose soft foods that you can mash with a fork When you are more symptomatic, change to pureed foods foods and liquids.  Consider reading "Living well with Gastroparesis" by Reuel Derby Gastroparesis is a condition in which food takes longer than normal to empty from the stomach. This condition is also known as delayed gastric emptying. It is usually a long-term (chronic) condition. There is no cure, but there are treatments and things that you can do at home to help relieve symptoms. Treating the underlying condition that causes gastroparesis can also help relieve symptoms What are the causes? In many cases, the cause of this condition is not known. Possible causes include: A hormone (endocrine) disorder, such as hypothyroidism or diabetes. A nervous system disease, such as Parkinson's disease or multiple sclerosis. Cancer, infection, or surgery that affects the stomach or vagus nerve. The vagus nerve runs from your chest, through your neck, and to the lower part of your brain. A connective tissue disorder, such as  scleroderma. Certain medicines. What increases the risk? You are more likely to develop this condition if: You have certain disorders or diseases. These may include: An endocrine disorder. An eating disorder. Amyloidosis. Scleroderma. Parkinson's disease. Multiple sclerosis. Cancer or infection of the stomach or the vagus nerve. You have had surgery on your stomach or vagus nerve. You take certain medicines. You are female. What are the signs or symptoms? Symptoms of this condition include: Feeling full after eating very little or a loss of appetite. Nausea, vomiting, or heartburn. Bloating of your abdomen. Inconsistent blood sugar (glucose) levels on blood tests. Unexplained weight loss. Acid from the stomach coming up into the esophagus (gastroesophageal reflux). Sudden tightening (spasm) of the stomach, which can be painful. Symptoms may come and go. Some people may not notice any symptoms. How is this diagnosed? This condition is diagnosed with tests, such as: Tests that check how long it takes food to move through the stomach and intestines. These tests include: Upper gastrointestinal (GI) series. For this test, you drink a liquid that shows up well on X-rays, and then X-rays are taken of your intestines. Gastric emptying scintigraphy. For this test, you eat food that contains a small amount of radioactive material, and then scans are taken. Wireless capsule  GI monitoring system. For this test, you swallow a pill (capsule) that records information about how foods and fluid move through your stomach. Gastric manometry. For this test, a tube is passed down your throat and into your stomach to measure electrical and muscular activity. Endoscopy. For this test, a long, thin tube with a camera and light on the end is passed down your throat and into your stomach to check for problems in your stomach lining. Ultrasound. This test uses sound waves to create images of the inside of your  body. This can help rule out gallbladder disease or pancreatitis as a cause of your symptoms. How is this treated? There is no cure for this condition, but treatment and home care may relieve symptoms. Treatment may include: Treating the underlying cause. Managing your symptoms by making changes to your diet and exercise habits. Taking medicines to control nausea and vomiting and to stimulate stomach muscles. Getting food through a feeding tube in the hospital. This may be done in severe cases. Having surgery to insert a device called a gastric electrical stimulator into your body. This device helps improve stomach emptying and control nausea and vomiting. Follow these instructions at home: Take over-the-counter and prescription medicines only as told by your health care provider. Follow instructions from your health care provider about eating or drinking restrictions. Your health care provider may recommend that you: Eat smaller meals more often. Eat low-fat foods. Eat low-fiber forms of high-fiber foods. For example, eat cooked vegetables instead of raw vegetables. Have only liquid foods instead of solid foods. Liquid foods are easier to digest. Drink enough fluid to keep your urine pale yellow. Exercise as often as told by your health care provider. Keep all follow-up visits. This is important. Contact a health care provider if you: Notice that your symptoms do not improve with treatment. Have new symptoms. Get help right away if you: Have severe pain in your abdomen that does not improve with treatment. Have nausea that is severe or does not go away. Vomit every time you drink fluids. Summary Gastroparesis is a long-term (chronic) condition in which food takes longer than normal to empty from the stomach. Symptoms include nausea, vomiting, heartburn, bloating of your abdomen, and loss of appetite. Eating smaller portions, low-fat foods, and low-fiber forms of high-fiber foods may help  you manage your symptoms. Get help right away if you have severe pain in your abdomen. This information is not intended to replace advice given to you by your health care provider. Make sure you discuss any questions you have with your health care provider. Document Revised: 02/04/2020 Document Reviewed: 02/04/2020 Elsevier Patient Education  2021 [employer].

## 2023-05-27 NOTE — Progress Notes (Signed)
Addendum: Reviewed and agree with assessment and management plan. Agree with upper GI series and if normal then would be agreeable to a trial of Motegrity for global intestinal hypomotility Cael Worth, Carie Caddy, MD

## 2023-05-31 ENCOUNTER — Telehealth: Payer: Self-pay | Admitting: Physician Assistant

## 2023-05-31 NOTE — Telephone Encounter (Signed)
Spoke with pt and she is aware of recommendations per Vicie Mutters PA.

## 2023-05-31 NOTE — Telephone Encounter (Signed)
Inbound call from patient stating that she was seen on 8/15 by Quentin Mulling and was given samples of Motegrity. Patient stated that since taking the medication she has been very nauseous. Patient is requesting a call to discuss. Please advise.

## 2023-05-31 NOTE — Telephone Encounter (Signed)
Pt states she was given samples of Motegrity. She started the Motegrity on Sunday and states that she had bad abdominal cramping. Monday and Tuesday she states she has been having diarrhea and nausea. Pt wants to know if the motegrity is supposed to cause these side effects and what else she might be able to try. Please advise.

## 2023-06-07 LAB — COLOGUARD

## 2023-06-08 ENCOUNTER — Telehealth: Payer: Self-pay

## 2023-06-08 ENCOUNTER — Other Ambulatory Visit: Payer: Self-pay

## 2023-06-08 DIAGNOSIS — Z1211 Encounter for screening for malignant neoplasm of colon: Secondary | ICD-10-CM

## 2023-06-16 ENCOUNTER — Ambulatory Visit (HOSPITAL_COMMUNITY)
Admission: RE | Admit: 2023-06-16 | Discharge: 2023-06-16 | Disposition: A | Discharge status: Home / Self Care | Payer: 59 | Source: Ambulatory Visit | Attending: Physician Assistant | Admitting: Physician Assistant

## 2023-06-16 DIAGNOSIS — R112 Nausea with vomiting, unspecified: Secondary | ICD-10-CM | POA: Diagnosis present

## 2023-06-16 DIAGNOSIS — R634 Abnormal weight loss: Secondary | ICD-10-CM | POA: Diagnosis present

## 2023-06-17 ENCOUNTER — Other Ambulatory Visit: Payer: Self-pay | Admitting: *Deleted

## 2023-06-17 DIAGNOSIS — K222 Esophageal obstruction: Secondary | ICD-10-CM

## 2023-06-26 LAB — COLOGUARD

## 2023-07-15 ENCOUNTER — Encounter: Payer: Self-pay | Admitting: Internal Medicine

## 2023-07-27 ENCOUNTER — Ambulatory Visit: Payer: 59 | Admitting: Internal Medicine

## 2023-07-27 ENCOUNTER — Encounter: Payer: Self-pay | Admitting: Internal Medicine

## 2023-07-27 VITALS — BP 98/53 | HR 64 | Temp 98.9°F | Resp 15 | Ht 65.0 in | Wt 111.0 lb

## 2023-07-27 DIAGNOSIS — K222 Esophageal obstruction: Secondary | ICD-10-CM

## 2023-07-27 DIAGNOSIS — R112 Nausea with vomiting, unspecified: Secondary | ICD-10-CM

## 2023-07-27 DIAGNOSIS — D132 Benign neoplasm of duodenum: Secondary | ICD-10-CM

## 2023-07-27 DIAGNOSIS — K317 Polyp of stomach and duodenum: Secondary | ICD-10-CM | POA: Diagnosis not present

## 2023-07-27 DIAGNOSIS — K319 Disease of stomach and duodenum, unspecified: Secondary | ICD-10-CM | POA: Diagnosis not present

## 2023-07-27 DIAGNOSIS — R933 Abnormal findings on diagnostic imaging of other parts of digestive tract: Secondary | ICD-10-CM

## 2023-07-27 MED ORDER — PANTOPRAZOLE SODIUM 40 MG PO TBEC
40.0000 mg | DELAYED_RELEASE_TABLET | Freq: Every day | ORAL | 3 refills | Status: DC
Start: 2023-07-27 — End: 2023-08-17

## 2023-07-27 MED ORDER — SODIUM CHLORIDE 0.9 % IV SOLN: 500.0000 mL | Freq: Once | INTRAVENOUS | Status: DC

## 2023-07-27 NOTE — Progress Notes (Signed)
Patient nauseated pre-procedure. Seems this is baseline for patient. IV Zofran given to patient.  Uneventful anesthetic. Report to pacu rn. Vss. Care resumed by rn.

## 2023-07-27 NOTE — Progress Notes (Signed)
Called to room to assist during endoscopic procedure.  Patient ID and intended procedure confirmed with present staff. Received instructions for my participation in the procedure from the performing physician.  

## 2023-07-27 NOTE — Patient Instructions (Signed)
Discharge instructions given. Handouts on Dilatation diet and Hiatal Hernia. Resume previous medications. Prescription sent to pharmacy. YOU HAD AN ENDOSCOPIC PROCEDURE TODAY AT THE [facility]:   Refer to the procedure report that was given to you for any specific questions about what was found during the examination.  If the procedure report does not answer your questions, please call your gastroenterologist to clarify.  If you requested that your care partner not be given the details of your procedure findings, then the procedure report has been included in a sealed envelope for you to review at your convenience later.  YOU SHOULD EXPECT: Some feelings of bloating in the abdomen. Passage of more gas than usual.  Walking can help get rid of the air that was put into your GI tract during the procedure and reduce the bloating. If you had a lower endoscopy (such as a colonoscopy or flexible sigmoidoscopy) you may notice spotting of blood in your stool or on the toilet paper. If you underwent a bowel prep for your procedure, you may not have a normal bowel movement for a few days.  Please Note:  You might notice some irritation and congestion in your nose or some drainage.  This is from the oxygen used during your procedure.  There is no need for concern and it should clear up in a day or so.  SYMPTOMS TO REPORT IMMEDIATELY:   Following upper endoscopy (EGD)  Vomiting of blood or coffee ground material  New chest pain or pain under the shoulder blades  Painful or persistently difficult swallowing  New shortness of breath  Fever of 100F or higher  Plaia, tarry-looking stools  For urgent or emergent issues, a gastroenterologist can be reached at any hour by calling [phone]. Do not use MyChart messaging for urgent concerns.    DIET:  We do recommend a small meal at first, but then you may proceed to your regular diet.  Drink plenty of fluids but you should avoid alcoholic  beverages for 24 hours.  ACTIVITY:  You should plan to take it easy for the rest of today and you should NOT DRIVE or use heavy machinery until tomorrow (because of the sedation medicines used during the test).    FOLLOW UP: Our staff will call the number listed on your records the next business day following your procedure.  We will call around 7:15- 8:00 am to check on you and address any questions or concerns that you may have regarding the information given to you following your procedure. If we do not reach you, we will leave a message.     If any biopsies were taken you will be contacted by phone or by letter within the next 1-3 weeks.  Please call us at [phone] if you have not heard about the biopsies in 3 weeks.    SIGNATURES/CONFIDENTIALITY: You and/or your care partner have signed paperwork which will be entered into your electronic medical record.  These signatures attest to the fact that that the information above on your After Visit Summary has been reviewed and is understood.  Full responsibility of the confidentiality of this discharge information lies with you and/or your care-partner.

## 2023-07-27 NOTE — Progress Notes (Signed)
GASTROENTEROLOGY PROCEDURE H&P NOTE   Primary Care Physician: Erskine Emery, NP    Reason for Procedure:   Nausea and vomiting and abnormal esophagram  Plan:    EGD and dilation  Patient is appropriate for endoscopic procedure(s) in the ambulatory ([facility]) setting.  The nature of the procedure, as well as the risks, benefits, and alternatives were carefully and thoroughly reviewed with the patient. Ample time for discussion and questions allowed. The patient understood, was satisfied, and agreed to proceed.     HPI: Leslie Schwartz is a 67 y.o. female who presents for EGD.  Medical history as below.  No recent chest pain or shortness of breath.  No abdominal pain today.  Past Medical History:  Diagnosis Date   Anemia    Anxiety    Aortic atherosclerosis (HCC)    Asthma    Bipolar disorder (HCC)    Cataract    COPD (chronic obstructive pulmonary disease) (HCC)    Depression    Emphysema of lung (HCC)    Gastritis    Gastroparesis    Glaucoma    Hyperlipidemia    Irritable bowel syndrome with constipation    Osteoporosis    PTSD (post-traumatic stress disorder)    Sleep apnea    Vitamin D insufficiency     Past Surgical History:  Procedure Laterality Date   ABDOMINAL HYSTERECTOMY  1998   EXCISION OF ABDOMINAL WALL TUMOR      Prior to Admission medications   Medication Sig Start Date End Date Taking? Authorizing Provider  Albuterol Sulfate (PROAIR HFA IN) Inhale 1 puff into the lungs as needed (Shortness of breath / Wheezing).   Yes [provider]  Calcium-Magnesium-Vitamin D (CALCIUM 1200+D3 PO) Take 1 tablet by mouth daily.   Yes [provider]  clonazePAM (KLONOPIN) 1 MG tablet TK 1 T PO TID 06/22/17  Yes [provider]  lamoTRIgine (LAMICTAL) 100 MG tablet Take 100 mg by mouth at bedtime. 09/21/21  Yes [provider]  Multiple Vitamins-Minerals (MULTIVITAMIN WOMEN 50+ PO) Take 1 tablet by mouth daily.   Yes [provider]  ondansetron (ZOFRAN-ODT) 4 MG disintegrating tablet Take 1-2 tablets (4-8 mg total) by mouth 2 (two) times daily. 12/11/19  Yes Armel Rabbani, Carie Caddy, MD  raloxifene (EVISTA) 60 MG tablet Take 60 mg by mouth daily. 10/13/20  Yes [provider]  rosuvastatin (CRESTOR) 10 MG tablet Take 1 tablet (10 mg total) by mouth daily. 02/02/23  Yes Baldo Daub, MD  tiZANidine (ZANAFLEX) 4 MG tablet Take 4 mg by mouth every 8 (eight) hours. 10/09/19  Yes [provider]  TRINTELLIX 10 MG TABS tablet Take 20 mg by mouth daily.  09/09/19  Yes [provider]  levETIRAcetam (KEPPRA) 500 MG tablet Take 500 mg by mouth 2 (two) times daily. Patient not taking: Reported on 07/27/2023 06/25/19   [provider]  metoprolol tartrate (LOPRESSOR) 100 MG tablet Take 1 tablet (100 mg total) by mouth once for 1 dose. Please take this medication 2 hours before CT 02/02/23 02/02/23  Baldo Daub, MD    Current Outpatient Medications  Medication Sig Dispense Refill   Albuterol Sulfate (PROAIR HFA IN) Inhale 1 puff into the lungs as needed (Shortness of breath / Wheezing).     Calcium-Magnesium-Vitamin D (CALCIUM 1200+D3 PO) Take 1 tablet by mouth daily.     clonazePAM (KLONOPIN) 1 MG tablet TK 1 T PO TID  2   lamoTRIgine (LAMICTAL) 100 MG tablet  Take 100 mg by mouth at bedtime.     Multiple Vitamins-Minerals (MULTIVITAMIN WOMEN 50+ PO) Take 1 tablet by mouth daily.     ondansetron (ZOFRAN-ODT) 4 MG disintegrating tablet Take 1-2 tablets (4-8 mg total) by mouth 2 (two) times daily. 90 tablet 2   raloxifene (EVISTA) 60 MG tablet Take 60 mg by mouth daily.     rosuvastatin (CRESTOR) 10 MG tablet Take 1 tablet (10 mg total) by mouth daily. 90 tablet 3   tiZANidine (ZANAFLEX) 4 MG tablet Take 4 mg by mouth every 8 (eight) hours.     TRINTELLIX 10 MG TABS tablet Take 20 mg by mouth daily.      levETIRAcetam (KEPPRA) 500 MG tablet Take 500 mg by mouth 2 (two) times daily. (Patient not  taking: Reported on 07/27/2023)     metoprolol tartrate (LOPRESSOR) 100 MG tablet Take 1 tablet (100 mg total) by mouth once for 1 dose. Please take this medication 2 hours before CT 1 tablet 0   Current Facility-Administered Medications  Medication Dose Route Frequency Provider Last Rate Last Admin   0.9 %  sodium chloride infusion  500 mL Intravenous Once Alexee Delsanto, Carie Caddy, MD       regadenoson Eugenie Birks) injection SOLN 0.4 mg  0.4 mg Intravenous Once Georgeanna Lea, MD        Allergies as of 07/27/2023 - Review Complete 07/27/2023  Allergen Reaction Noted   Reglan [metoclopramide] Other (See Comments) 09/12/2019   Macrolides and ketolides  12/10/2019   Sulfa antibiotics Itching 06/04/2015    Family History  Problem Relation Age of Onset   Hyperlipidemia Mother    Hypertension Mother    Lung cancer Father    Colon cancer Neg Hx    Stomach cancer Neg Hx    Rectal cancer Neg Hx    Esophageal cancer Neg Hx     Social History   Socioeconomic History   Marital status: Divorced    Spouse name: Not on file   Number of children: 2   Years of education: Not on file   Highest education level: Not on file  Occupational History   Not on file  Tobacco Use   Smoking status: Former    Current packs/day: 0.00    Types: Cigarettes    Quit date: 07/30/2019    Years since quitting: 3.9   Smokeless tobacco: Never  Vaping Use   Vaping status: Former   Substances: THC  Substance and Sexual Activity   Alcohol use: Yes    Comment: wine every 2 months   Drug use: Never   Sexual activity: Not on file  Other Topics Concern   Not on file  Social History Narrative   Not on file   Social Determinants of Health   Financial Resource Strain: Not on file  Food Insecurity: Not on file  Transportation Needs: Not on file  Physical Activity: Not on file  Stress: Not on file  Social Connections: Not on file  Intimate Partner Violence: Not on file    Physical Exam: Vital signs in last  24 hours: @BP  121/70   Pulse 83   Temp 98.9 F (37.2 C) (Temporal)   Resp 12   Ht 5\' 5"  (1.651 m)   Wt 111 lb (50.3 kg)   SpO2 96%   BMI 18.47 kg/m  GEN: NAD EYE: Sclerae anicteric ENT: MMM CV: Non-tachycardic Pulm: CTA b/l GI: Soft, NT/ND NEURO:  Alert & Oriented x 3   Erick Blinks, MD  [facility]  07/27/2023 9:38 AM

## 2023-07-27 NOTE — Op Note (Signed)
[facility] Patient Name: Leslie Schwartz Procedure Date: 07/27/2023 9:36 AM MRN: [id number] Endoscopist: Beverley Fiedler , MD, [id number] Age: 67 Referring MD:  Date of Birth: [birth year] Gender: Female Account #: [id number] Procedure:                Upper GI endoscopy Indications:              Dysphagia, Abnormal cine-esophagram, Nausea with                            vomiting Medicines:                Monitored Anesthesia Care Procedure:                Pre-Anesthesia Assessment:                           - Prior to the procedure, a History and Physical                            was performed, and patient medications and                            allergies were reviewed. The patient's tolerance of                            previous anesthesia was also reviewed. The risks                            and benefits of the procedure and the sedation                            options and risks were discussed with the patient.                            All questions were answered, and informed consent                            was obtained. Prior Anticoagulants: The patient has                            taken no anticoagulant or antiplatelet agents. ASA                            Grade Assessment: III - A patient with severe                            systemic disease. After reviewing the risks and                            benefits, the patient was deemed in satisfactory                            condition to undergo the procedure.  After obtaining informed consent, the endoscope was                            passed under direct vision. Throughout the                            procedure, the patient's blood pressure, pulse, and                            oxygen saturations were monitored continuously. The                            GIF [id number] #[id number] was introduced through the                            mouth, and advanced to the second part of  duodenum.                            The upper GI endoscopy was accomplished without                            difficulty. The patient tolerated the procedure                            well. Scope In: Scope Out: Findings:                 One benign-appearing, intrinsic moderate                            (circumferential scarring or stenosis; an endoscope                            may pass) stenosis was found 36 cm from the                            incisors. This stenosis measured 1.2 cm (inner                            diameter) x less than one cm (in length). The                            stenosis was traversed. A TTS dilator was passed                            through the scope. Dilation with a 15-16.5-18 mm                            balloon dilator was performed to 17 mm. The                            dilation site was examined and showed moderate  mucosal disruption.                           A 2 cm hiatal hernia was present.                           The entire examined stomach was normal. Biopsies                            were taken with a cold forceps for Helicobacter                            pylori testing.                           A single 5 mm sessile polyp was found in the second                            portion of the duodenum. The polyp was removed with                            a cold snare. Resection and retrieval were complete.                           The exam of the duodenum was otherwise normal. Complications:            No immediate complications. Estimated Blood Loss:     Estimated blood loss was minimal. Impression:               - Benign-appearing esophageal stenosis. Dilated.                           - 2 cm hiatal hernia.                           - Normal stomach. Biopsied.                           - A single duodenal polyp. Resected and retrieved. Recommendation:           - Patient has a contact number available  for                            emergencies. The signs and symptoms of potential                            delayed complications were discussed with the                            patient. Return to normal activities tomorrow.                            Written discharge instructions were provided to the                            patient.                           -  Resume previous diet; gastroparesis diet.                           - Continue present medications.                           - Add pantoprazole 40 mg once daily.                           - Could consider trial of domperidone (EKG 1st)                            depending on pathology results (Reglan allergy).                           - Await pathology results. Beverley Fiedler, MD 07/27/2023 10:04:06 AM This report has been signed electronically.

## 2023-07-28 ENCOUNTER — Telehealth: Payer: Self-pay

## 2023-07-28 NOTE — Telephone Encounter (Signed)
No answer/VM full.

## 2023-07-29 LAB — SURGICAL PATHOLOGY

## 2023-08-02 ENCOUNTER — Encounter: Payer: Self-pay | Admitting: Internal Medicine

## 2023-08-02 ENCOUNTER — Telehealth: Payer: Self-pay | Admitting: Internal Medicine

## 2023-08-02 NOTE — Telephone Encounter (Signed)
Pt states that for 3 days after her EGD she felt good but for the last 3 days she has been vomiting liquid. She states the liquid comes up in big pockets of air. She has not tried eating any solids, trying to get the liquid to stay down. Pt wants to know what Dr. Rhea Belton recommends. Please advise.

## 2023-08-02 NOTE — Telephone Encounter (Signed)
Inbound call from patient, states she has been vomiting pockets of air and "liquid foam", states she is seeing no improvement from Esophagus stretching. Would like to speak with a nurse in regards to symptoms.

## 2023-08-03 NOTE — Telephone Encounter (Signed)
1st try to increase pantoprazole to 40 mg BID-AC JMP

## 2023-08-03 NOTE — Telephone Encounter (Signed)
Spoke with pt and she is aware of recommendations per Dr. Rhea Belton. States she does feel some better today. She knows to call with any other concerns.

## 2023-08-06 IMAGING — US US THYROID
1 series · 13 of 25 positions shown · non-contrast
Comparison: 04/30/2021

CLINICAL DATA: Goiter.

EXAM:
THYROID ULTRASOUND
TECHNIQUE: Ultrasound examination of the thyroid gland and adjacent soft
tissues was performed.

[Series 1: us thyroid · 0.05mm/px · 13 of 36 slices shown]
[im 1/36]
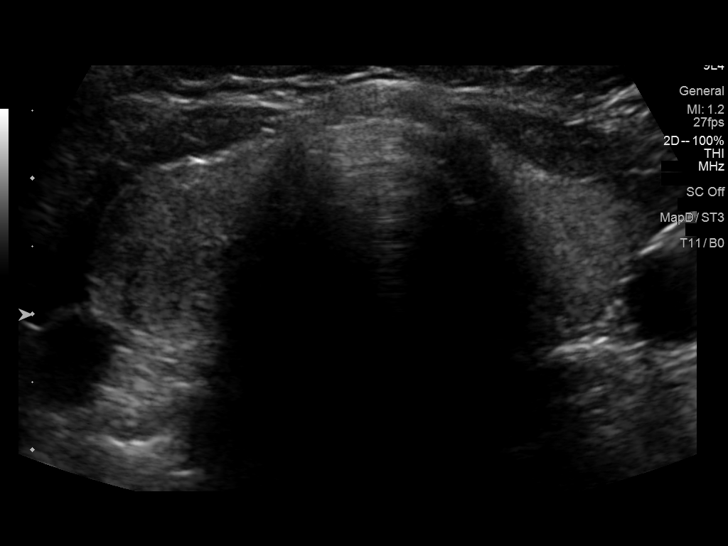
[im 3/36]
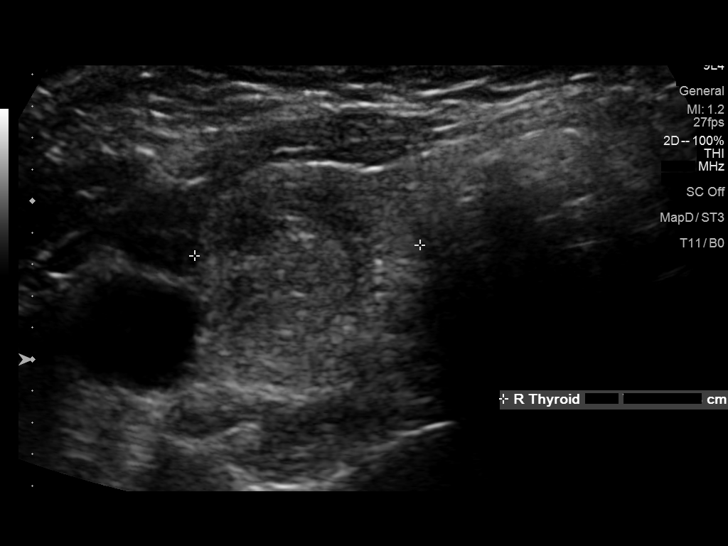
[im 6/36]
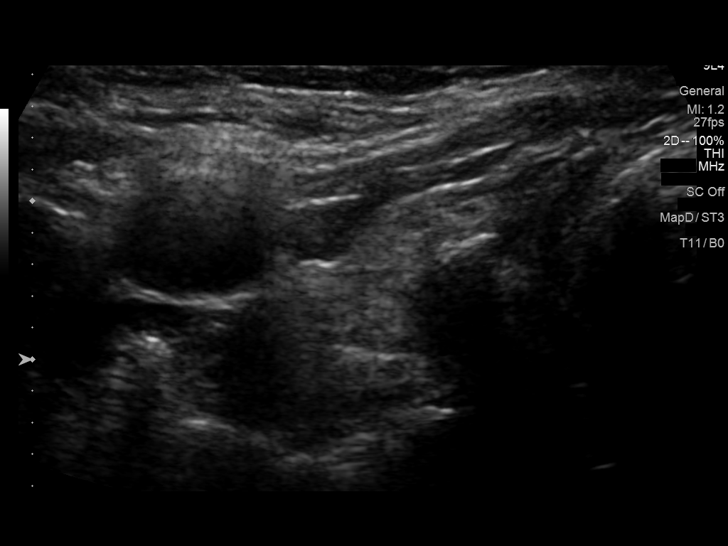
[im 9/36]
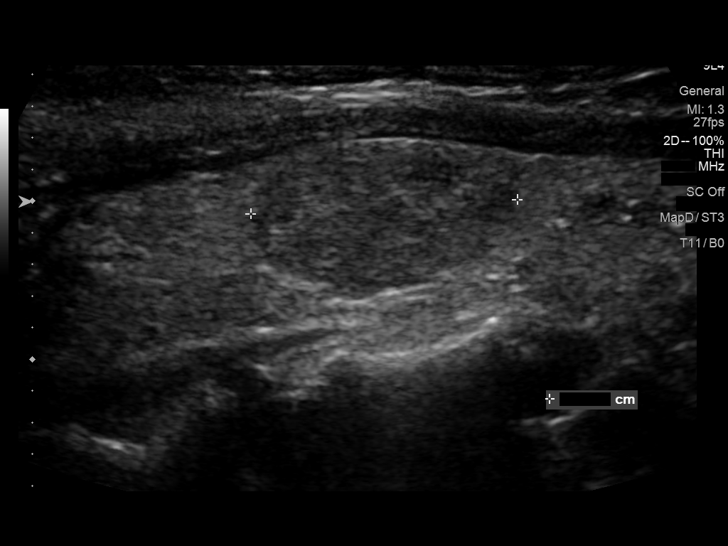
[im 12/36]
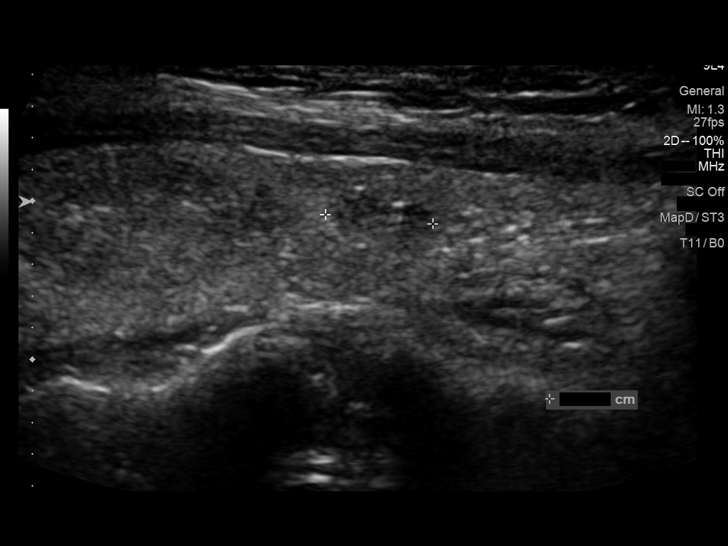
[im 15/36]
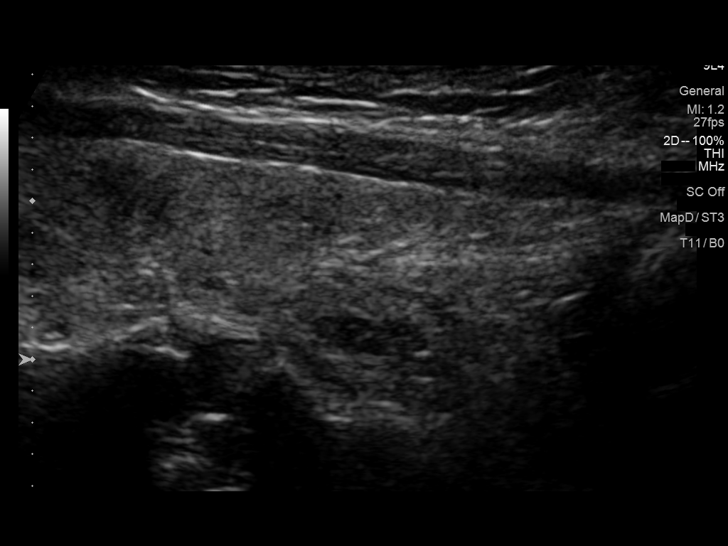
[im 18/36]
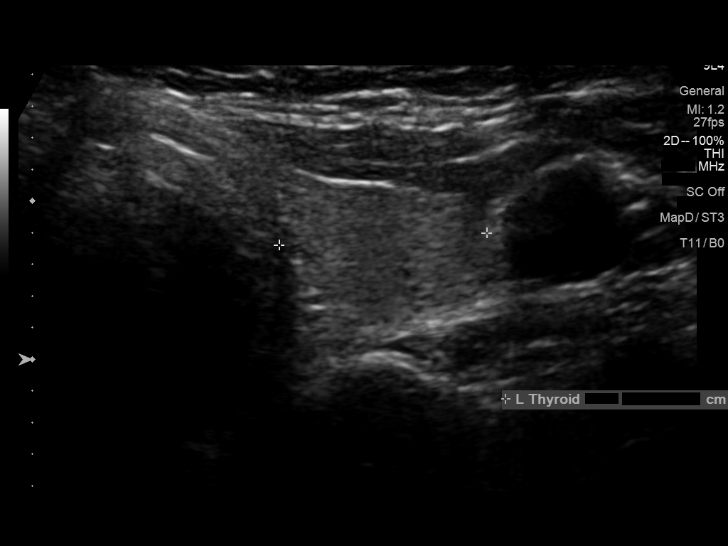
[im 21/36]
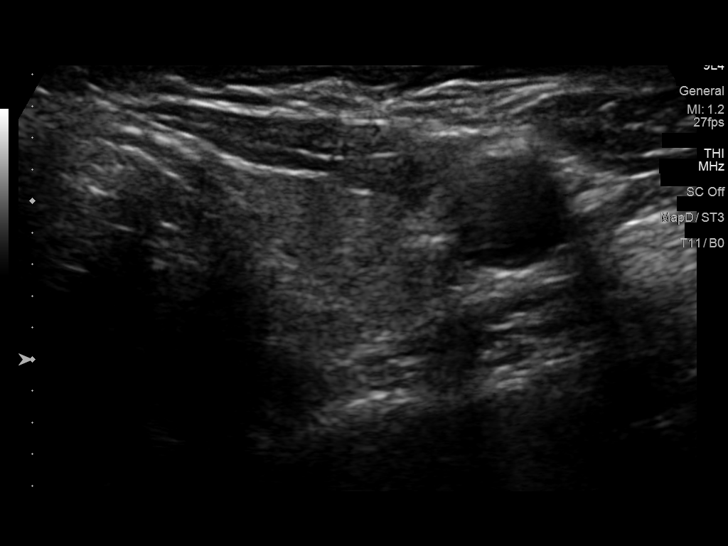
[im 24/36]
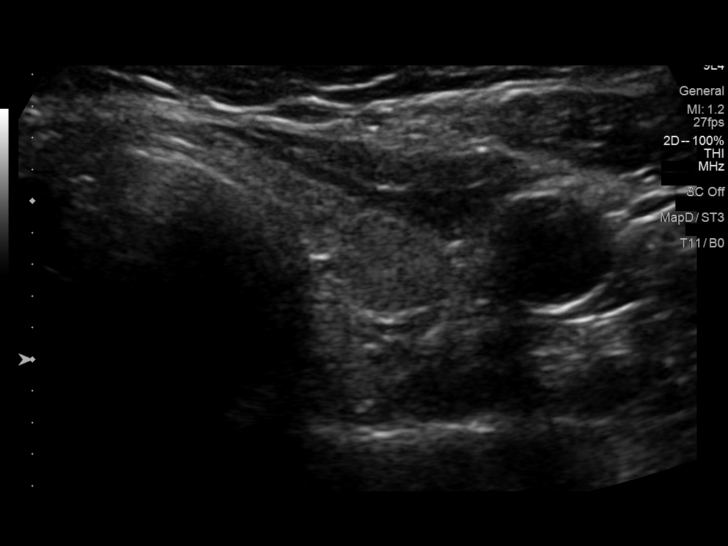
[im 27/36]
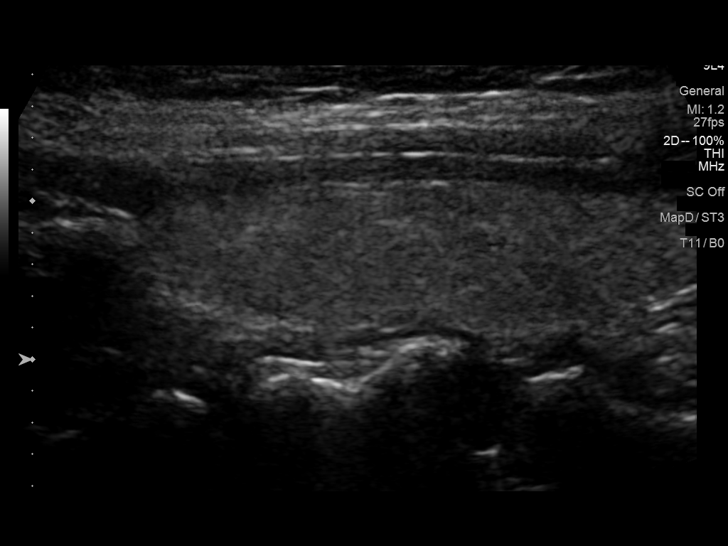
[im 30/36]
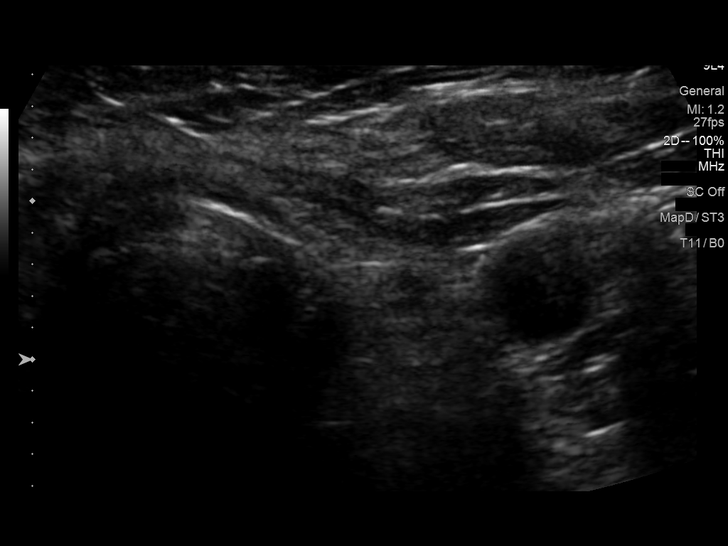
[im 33/36]
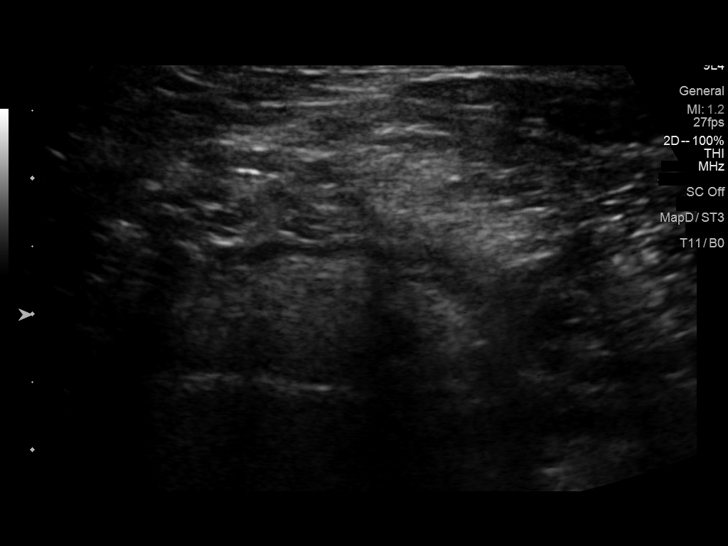
[im 36/36]
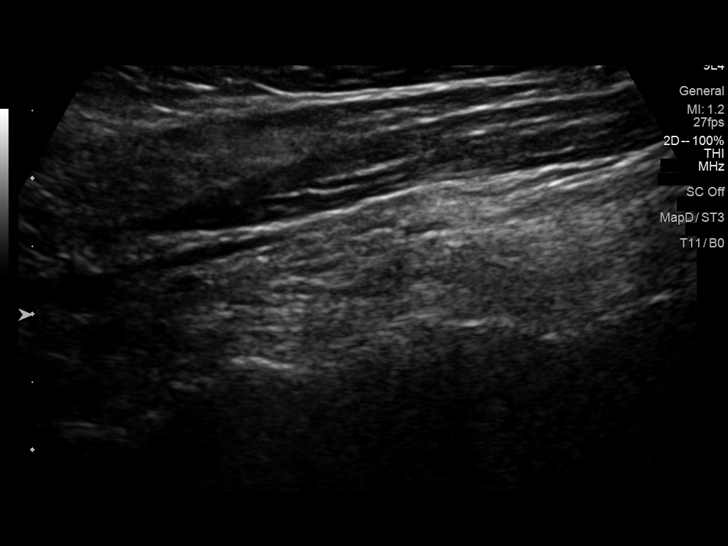

[13 of 25 positions shown; findings below may reference images not displayed]

FINDINGS: Parenchymal Echotexture: Normal

Isthmus: 0.3 cm, previously 0.2 cm

Right lobe: 4.2 x 1.1 x 1.4 cm, previously 4.8 x 1.8 x 1.2 cm

Left lobe: 4.2 x 1.0 x 1.3 cm, previously 4.8 x 1.0 x 1.1 cm

_________________________________________________________

Estimated total number of nodules >/= 1 cm: 1

Number of spongiform nodules >/=  2 cm not described below (TR1): 0

Number of mixed cystic and solid nodules >/= 1.5 cm not described
below (TR2): 0

_________________________________________________________

Nodule # 1:

Prior biopsy: No

Location: Right; Mid

Maximum size: 1.7 cm; Other 2 dimensions: 1.4 x 1.0 cm, previously,
1.8 x 1.2 x 1.2 cm

Composition: solid/almost completely solid (2)

Echogenicity: hypoechoic (2)

Shape: not taller-than-wide (0)

Margins: smooth (0)

Echogenic foci: none (0)

ACR TI-RADS total points: 4.

ACR TI-RADS risk category:  TR4 (4-6 points).

Significant change in size (>/= 20% in two dimensions and minimal
increase of 2 mm): No

Change in features: No

Change in ACR TI-RADS risk category: No

ACR TI-RADS recommendations:

**Given size (>/= 1.5 cm) and appearance, fine needle aspiration of
this moderately suspicious nodule should be considered based on
TI-RADS criteria.

_________________________________________________________

Unchanged 0.7 cm spongiform nodule in the right inferior thyroid
which again appears benign. No cervical lymphadenopathy.
IMPRESSION: Similar appearance of previously visualized right mid solid thyroid
nodule (labeled 1, 1.7 cm, previously 1.8 cm) which again meets
criteria (TI-RADS category 4) for tissue sampling. Recommend
ultrasound-guided fine-needle aspiration.

The above is in keeping with the ACR TI-RADS recommendations - [HOSPITAL] 7667;[DATE].

## 2023-08-11 DIAGNOSIS — Z789 Other specified health status: Secondary | ICD-10-CM | POA: Insufficient documentation

## 2023-08-11 DIAGNOSIS — Z681 Body mass index (BMI) 19 or less, adult: Secondary | ICD-10-CM | POA: Insufficient documentation

## 2023-08-11 DIAGNOSIS — Z1231 Encounter for screening mammogram for malignant neoplasm of breast: Secondary | ICD-10-CM | POA: Insufficient documentation

## 2023-08-17 ENCOUNTER — Telehealth: Payer: Self-pay | Admitting: Internal Medicine

## 2023-08-17 DIAGNOSIS — R112 Nausea with vomiting, unspecified: Secondary | ICD-10-CM

## 2023-08-17 MED ORDER — PANTOPRAZOLE SODIUM 40 MG PO TBEC: 40.0000 mg | DELAYED_RELEASE_TABLET | Freq: Two times a day (BID) | ORAL | 3 refills | Status: AC

## 2023-08-17 NOTE — Telephone Encounter (Signed)
Inbound call from patient stating that she has been taking Protonix 2 times daily and was given a 90 day supply in which she was taking once daily. Patient is requesting prescription be changed to 2 times daily and given the correct amount of medication. Please advise.

## 2023-08-17 NOTE — Telephone Encounter (Signed)
Patient states since she has increased her pantoprazole to twice a day she has been feeling a lot better. Informed patient I will send in pantoprazole twice a day to her pharmacy. Patient verbalized understanding.

## 2023-09-01 ENCOUNTER — Telehealth: Payer: Self-pay | Admitting: Internal Medicine

## 2023-09-01 NOTE — Telephone Encounter (Signed)
Referral faxed to [facility] for appt at [facility] office. Records faxed to [phone]. Pt knows they will contact her to set up the appt once the records are reviewed.

## 2023-09-01 NOTE — Telephone Encounter (Signed)
Inbound call from patient stating that she needs to speak with the nurse regarding having surgery. Patient stated that she is not any better. Please advise.

## 2023-09-01 NOTE — Telephone Encounter (Signed)
Pt calling states the dilation did not help. Reports she is vomiting throughout the day. States she would need to go to [facility] for surgery and she states she cannot do that due to no transportation. Reports she is currently at 101.4 pounds. States that when she was here for her procedure she was told a doctor may be able to come from [facility] to [place] that can perform surgery here. Please advise, she states it is surgery for gastroparesis.

## 2023-09-01 NOTE — Telephone Encounter (Signed)
Patient can be referred to [facility], they do occasionally see patients in their [address] Location Ongoing nausea and vomiting

## 2023-09-12 NOTE — Telephone Encounter (Signed)
PT is calling to get an update on the referral that was to be sent for her to [facility]. Please advise.

## 2023-09-14 NOTE — Telephone Encounter (Signed)
Spoke with pt and let her know [facility] has been trying to reach her since 09/01/23 regarding the appt. Pt given the office number to call regarding appt. [phone].

## 2023-11-10 DIAGNOSIS — F3341 Major depressive disorder, recurrent, in partial remission: Secondary | ICD-10-CM | POA: Insufficient documentation

## 2023-11-10 DIAGNOSIS — Z5181 Encounter for therapeutic drug level monitoring: Secondary | ICD-10-CM | POA: Insufficient documentation

## 2023-11-10 DIAGNOSIS — Z79899 Other long term (current) drug therapy: Secondary | ICD-10-CM | POA: Insufficient documentation

## 2023-11-10 DIAGNOSIS — F1920 Other psychoactive substance dependence, uncomplicated: Secondary | ICD-10-CM | POA: Insufficient documentation

## 2024-03-19 ENCOUNTER — Other Ambulatory Visit (HOSPITAL_BASED_OUTPATIENT_CLINIC_OR_DEPARTMENT_OTHER): Payer: Self-pay | Admitting: Family

## 2024-03-19 DIAGNOSIS — Z1231 Encounter for screening mammogram for malignant neoplasm of breast: Secondary | ICD-10-CM

## 2024-04-05 ENCOUNTER — Telehealth: Payer: Self-pay | Admitting: Gastroenterology

## 2024-04-05 NOTE — Telephone Encounter (Signed)
 Good morning Dr. Stacia  The following patient is requesting to transfer her care to us  after seeing [facility]. She is not happy at all with the care she has received with Dr. Gwynneth. She has notations from 5/22 showing her distaste in the care. She was scheduled for an ov on 7/2 with Elida Shawl because she wanted to bring records in. Are you willing to review and accept this patient. Please review and advise of scheduling. Thank you.

## 2024-04-10 NOTE — Telephone Encounter (Addendum)
 Dr. Albertus,  This is actually your patient. Do you wish to accept her back?

## 2024-04-11 ENCOUNTER — Ambulatory Visit: Admitting: Nurse Practitioner

## 2024-04-11 NOTE — Telephone Encounter (Signed)
 At this time I recommend that she continue with the evaluation recently recommended by [facility]. I have reviewed those records and additional tests have been recommended Her nausea vomiting is chronic however it is noted that she had elevated vitamin D and calcium  at the upper limit of normal.  I would recommend that she discuss the elevated vitamin D levels with her [facility] team.

## 2024-04-13 DIAGNOSIS — Z681 Body mass index (BMI) 19 or less, adult: Secondary | ICD-10-CM | POA: Insufficient documentation

## 2024-05-18 ENCOUNTER — Telehealth: Payer: Self-pay | Admitting: *Deleted

## 2024-05-18 NOTE — Telephone Encounter (Signed)
 Patient scheduled for an in office visit with Delon Hoover, NP on 05/23/24.

## 2024-05-18 NOTE — Telephone Encounter (Signed)
   Pre-operative Risk Assessment    Patient Name: Leslie Schwartz  DOB: 1956-05-20 MRN: [id number]     Request for Surgical Clearance    Procedure:  Dental Extraction - Amount of Teeth to be Pulled:  called office and they stated 2-4 dental implants  Date of Surgery:  Clearance TBD                                 Surgeon:  Not indicated Surgeon's Group or Practice Name:  [facility] Phone number:  [phone] Fax number:  [phone]   Type of Clearance Requested:   - Medical    Type of Anesthesia:  Local    Additional requests/questions:  Known Medical Conditions? Current Medications, Known Allergies. Are there any special precautions or contraindications to dental treatment? Does the pt require antibiotic prophylasis prior to dental cleanings and or fillings, extractions, etc.?   Signed, Arloa Donovan Dines   05/18/2024, 9:47 AM

## 2024-05-18 NOTE — Telephone Encounter (Signed)
    Primary Cardiologist:None  Chart reviewed as part of pre-operative protocol coverage. Because of Harmoni Lucus past medical history and time since last visit, he/she will require a follow-up visit in order to better assess preoperative cardiovascular risk.  Pre-op covering staff: - Please schedule office appointment and call patient to inform them. - Please contact requesting surgeon's office via preferred method (i.e, phone, fax) to inform them of need for appointment prior to surgery.  If applicable, this message will also be routed to pharmacy pool and/or primary cardiologist for input on holding anticoagulant/antiplatelet agent as requested below so that this information is available at time of patient's appointment.   Josefa CHRISTELLA Beauvais, NP  05/18/2024, 11:17 AM

## 2024-05-21 NOTE — Progress Notes (Addendum)
 Cardiology Office Note   Date:  05/23/2024  ID:  Leslie Schwartz, DOB 1956/05/13, MRN [id number] PCP: Silvano Angeline FALCON, NP  [facility] Providers Cardiologist:  Redell Leiter, MD Cardiology APP:  Carlin Delon BROCKS, NP     History of Present Illness Leslie Schwartz is a 68 y.o. female with a past medical history of non-obstructive CAD per cCTA, COPD, gastroparesis.   03/04/23 cCTA calcium  score 25.9, 69th percentile 09/26/2019 Myoview  normal, low risk  She established care with Dr. Leiter in December 2020 for  the evaluation of chest pain, she underwent a stress evaluation at that time which was abnormal, low risk study.  In 2024 she had a CT of her chest for lung cancer screening revealing aortic atherosclerosis so a coronary CTA was arranged revealing a calcium  score of 25.9, 69th percentile.    She presents today for preoperative evaluation for upcoming dental procedure.  From a cardiac perspective, she is not having any episodes of angina, shortness of breath.  She is very plagued by fatigue, ongoing GI issues.  She was told at 1 point she had gastroparesis however evaluated by another GI doctor and was told she does not.  She would like a referral for another GI specialist. She denies chest pain, palpitations, dyspnea, pnd, orthopnea, n, v, , syncope, edema, weight gain, or early satiety.    ROS: Review of Systems  Constitutional:  Positive for weight loss.  Gastrointestinal:  Positive for nausea and vomiting.     Studies Reviewed      Cardiac Studies & Procedures   ______________________________________________________________________________________________   STRESS TESTS  MYOCARDIAL PERFUSION IMAGING 09/26/2019  Interpretation Summary  The left ventricular ejection fraction is normal (55-65%).  Nuclear stress EF: 55%.  There was no ST segment deviation noted during stress.  This is a low risk study.  No evidence of ischemia or MI.  Normal LVEF.             ______________________________________________________________________________________________      Risk Assessment/Calculations           Physical Exam VS:  BP 100/80   Pulse 72   Ht 5' 5 (1.651 m)   Wt 102 lb (46.3 kg)   SpO2 96%   BMI 16.97 kg/m        Wt Readings from Last 3 Encounters:  05/23/24 102 lb (46.3 kg)  07/27/23 111 lb (50.3 kg)  05/26/23 111 lb 2 oz (50.4 kg)    GEN: Thin, no acute distress NECK: No JVD; No carotid bruits CARDIAC: RRR, no murmurs, rubs, gallops RESPIRATORY:  Clear to auscultation without rales, wheezing or rhonchi  ABDOMEN: Soft, non-tender, non-distended EXTREMITIES:  No edema; No deformity   ASSESSMENT AND PLAN CAD -nonobstructive per coronary CTA in 2024. Stable with no anginal symptoms. No indication for ischemic evaluation.    Dyslipidemia-Crestor  10 mg daily, will repeat FLP and LP(a).   Fatigue and dizziness - repeat echo. Likely multifactorial, but will repeat echocardiogram. Will repeat CBC, CMET, thyroid panel for any contributory causes.   Preoperative cardiovascular evaluation- upcoming dental procedure, According to the Revised Cardiac Risk Index (RCRI), her Perioperative Risk of Major Cardiac Event is (%): 0.4 Her Functional Capacity in METs is: 5.07 according to the Duke Activity Status Index (DASI). Therefore, based on ACC/AHA guidelines, patient would be at acceptable risk for the planned procedure without further cardiovascular testing. We are planning an echo, but this does not have to be completed prior to dental procedure. I will route this  recommendation to the requesting party via Epic fax function.        Dispo: Labs per above, echo. Follow up 1 year.   Signed, Delon JAYSON Hoover, NP

## 2024-05-22 ENCOUNTER — Other Ambulatory Visit: Payer: Self-pay

## 2024-05-22 DIAGNOSIS — H269 Unspecified cataract: Secondary | ICD-10-CM | POA: Insufficient documentation

## 2024-05-22 DIAGNOSIS — H409 Unspecified glaucoma: Secondary | ICD-10-CM | POA: Insufficient documentation

## 2024-05-22 DIAGNOSIS — Q859 Phakomatosis, unspecified: Secondary | ICD-10-CM | POA: Insufficient documentation

## 2024-05-22 DIAGNOSIS — Q825 Congenital non-neoplastic nevus: Secondary | ICD-10-CM | POA: Insufficient documentation

## 2024-05-23 ENCOUNTER — Ambulatory Visit: Attending: Cardiology | Admitting: Cardiology

## 2024-05-23 ENCOUNTER — Encounter: Payer: Self-pay | Admitting: Cardiology

## 2024-05-23 VITALS — BP 100/80 | HR 72 | Ht 65.0 in | Wt 102.0 lb

## 2024-05-23 DIAGNOSIS — E782 Mixed hyperlipidemia: Secondary | ICD-10-CM

## 2024-05-23 DIAGNOSIS — R5383 Other fatigue: Secondary | ICD-10-CM

## 2024-05-23 DIAGNOSIS — R42 Dizziness and giddiness: Secondary | ICD-10-CM

## 2024-05-23 DIAGNOSIS — Z01818 Encounter for other preprocedural examination: Secondary | ICD-10-CM

## 2024-05-23 DIAGNOSIS — I2510 Atherosclerotic heart disease of native coronary artery without angina pectoris: Secondary | ICD-10-CM

## 2024-05-23 NOTE — Patient Instructions (Addendum)
 Medication Instructions:   No changes  *If you need a refill on your cardiac medications before your next appointment, please call your pharmacy*  Lab Work:  CMET, CBC, FLP, LP(a) TSH Magnesium  If you have labs (blood work) drawn today and your tests are completely normal, you will receive your results only by: MyChart Message (if you have MyChart) OR A paper copy in the mail If you have any lab test that is abnormal or we need to change your treatment, we will call you to review the results.  Testing/Procedures: Your physician has requested that you have an echocardiogram. Echocardiography is a painless test that uses sound waves to create images of your heart. It provides your doctor with information about the size and shape of your heart and how well your heart's chambers and valves are working. This procedure takes approximately one hour. There are no restrictions for this procedure. Please do NOT wear cologne, perfume, aftershave, or lotions (deodorant is allowed). Please arrive 15 minutes prior to your appointment time.  Please note: We ask at that you not bring children with you during ultrasound (echo/ vascular) testing. Due to room size and safety concerns, children are not allowed in the ultrasound rooms during exams. Our front office staff cannot provide observation of children in our lobby area while testing is being conducted. An adult accompanying a patient to their appointment will only be allowed in the ultrasound room at the discretion of the ultrasound technician under special circumstances. We apologize for any inconvenience.   Follow-Up: At [facility], you and your health needs are our priority.  As part of our continuing mission to provide you with exceptional heart care, our providers are all part of one team.  This team includes your primary Cardiologist (physician) and Advanced Practice Providers or APPs (Physician Assistants and Nurse Practitioners)  who all work together to provide you with the care you need, when you need it.  Your next appointment:   1  year   Provider:   Redell Leiter, MD    We recommend signing up for the patient portal called MyChart.  Sign up information is provided on this After Visit Summary.  MyChart is used to connect with patients for Virtual Visits (Telemedicine).  Patients are able to view lab/test results, encounter notes, upcoming appointments, etc.  Non-urgent messages can be sent to your provider as well.   To learn more about what you can do with MyChart, go to [email or web address].   Other Instructions

## 2024-05-25 LAB — CBC
Hematocrit: 31.8 % — ABNORMAL LOW (ref 34.0–46.6)
Hemoglobin: 10.4 g/dL — ABNORMAL LOW (ref 11.1–15.9)
MCH: 32.6 pg (ref 26.6–33.0)
MCHC: 32.7 g/dL (ref 31.5–35.7)
MCV: 100 fL — ABNORMAL HIGH (ref 79–97)
Platelets: 181 x10E3/uL (ref 150–450)
RBC: 3.19 x10E6/uL — ABNORMAL LOW (ref 3.77–5.28)
RDW: 12 % (ref 11.7–15.4)
WBC: 3.8 x10E3/uL (ref 3.4–10.8)

## 2024-05-25 LAB — LIPID PANEL
Chol/HDL Ratio: 2.1 ratio (ref 0.0–4.4)
Cholesterol, Total: 146 mg/dL (ref 100–199)
HDL: 71 mg/dL
LDL Chol Calc (NIH): 57 mg/dL (ref 0–99)
Triglycerides: 98 mg/dL (ref 0–149)
VLDL Cholesterol Cal: 18 mg/dL (ref 5–40)

## 2024-05-25 LAB — COMPREHENSIVE METABOLIC PANEL WITH GFR
ALT: 13 IU/L (ref 0–32)
AST: 31 IU/L (ref 0–40)
Albumin: 4.3 g/dL (ref 3.9–4.9)
Alkaline Phosphatase: 44 IU/L (ref 44–121)
BUN/Creatinine Ratio: 9 — ABNORMAL LOW (ref 12–28)
BUN: 10 mg/dL (ref 8–27)
Bilirubin Total: 0.3 mg/dL (ref 0.0–1.2)
CO2: 24 mmol/L (ref 20–29)
Calcium: 10.2 mg/dL (ref 8.7–10.3)
Chloride: 100 mmol/L (ref 96–106)
Creatinine, Ser: 1.06 mg/dL — ABNORMAL HIGH (ref 0.57–1.00)
Globulin, Total: 2.8 g/dL (ref 1.5–4.5)
Glucose: 66 mg/dL — ABNORMAL LOW (ref 70–99)
Potassium: 4.5 mmol/L (ref 3.5–5.2)
Sodium: 138 mmol/L (ref 134–144)
Total Protein: 7.1 g/dL (ref 6.0–8.5)
eGFR: 58 mL/min/1.73 — ABNORMAL LOW

## 2024-05-25 LAB — TSH+T4F+T3FREE
Free T4: 1.13 ng/dL (ref 0.82–1.77)
T3, Free: 2.8 pg/mL (ref 2.0–4.4)
TSH: 2.13 u[IU]/mL (ref 0.450–4.500)

## 2024-05-25 LAB — LIPOPROTEIN A (LPA): Lipoprotein (a): <8.4 nmol/L

## 2024-05-28 ENCOUNTER — Ambulatory Visit: Payer: Self-pay | Admitting: Cardiology

## 2024-05-31 ENCOUNTER — Telehealth: Payer: Self-pay | Admitting: Cardiology

## 2024-05-31 ENCOUNTER — Other Ambulatory Visit: Payer: Self-pay

## 2024-05-31 ENCOUNTER — Ambulatory Visit: Attending: Cardiology

## 2024-05-31 ENCOUNTER — Telehealth: Payer: Self-pay

## 2024-05-31 DIAGNOSIS — K3184 Gastroparesis: Secondary | ICD-10-CM

## 2024-05-31 DIAGNOSIS — I2510 Atherosclerotic heart disease of native coronary artery without angina pectoris: Secondary | ICD-10-CM

## 2024-05-31 DIAGNOSIS — E782 Mixed hyperlipidemia: Secondary | ICD-10-CM

## 2024-05-31 DIAGNOSIS — Z01810 Encounter for preprocedural cardiovascular examination: Secondary | ICD-10-CM | POA: Diagnosis not present

## 2024-05-31 DIAGNOSIS — Z01818 Encounter for other preprocedural examination: Secondary | ICD-10-CM

## 2024-05-31 LAB — ECHOCARDIOGRAM COMPLETE
Area-P 1/2: 4.39 cm2
S' Lateral: 2.5 cm

## 2024-05-31 NOTE — Telephone Encounter (Signed)
 Patient is requesting a referral to see Dr. Charlanne at [facility] in [place]. CB # [phone]

## 2024-05-31 NOTE — Telephone Encounter (Signed)
 Patient had called the office and was asking for a referral to Dr. Charlanne for a GI consult because she has gastroparesis. Spoke with Dr. Krasowski and he stated that it was ok to place the consult for GI with Dr. Charlanne in [place]. A GI consult with Dr. Charlanne was placed in Epic and the patient was made aware of the consult. Patient had no further questions at this time.

## 2024-06-18 NOTE — Telephone Encounter (Signed)
 Referral to Dr. Charlanne for gastroparesis placed via Epic on 05/31/24 per Dr. Bernie. Patient was made aware and had no further questions at this time.

## 2024-08-09 ENCOUNTER — Telehealth: Payer: Self-pay | Admitting: Cardiology

## 2024-08-09 NOTE — Telephone Encounter (Addendum)
 Patient was calling to see a CT scan result was received it should have been coming Dr. Candice office. She said if you don't have to please request it.

## 2024-08-23 ENCOUNTER — Encounter (HOSPITAL_BASED_OUTPATIENT_CLINIC_OR_DEPARTMENT_OTHER): Payer: Self-pay | Admitting: Radiology

## 2024-08-23 ENCOUNTER — Ambulatory Visit (HOSPITAL_BASED_OUTPATIENT_CLINIC_OR_DEPARTMENT_OTHER)
Admission: RE | Admit: 2024-08-23 | Discharge: 2024-08-23 | Disposition: A | Source: Ambulatory Visit | Attending: Family | Admitting: Family

## 2024-08-23 DIAGNOSIS — Z1231 Encounter for screening mammogram for malignant neoplasm of breast: Secondary | ICD-10-CM

## 2024-09-01 ENCOUNTER — Other Ambulatory Visit: Payer: Self-pay | Admitting: Internal Medicine

## 2024-09-01 DIAGNOSIS — R112 Nausea with vomiting, unspecified: Secondary | ICD-10-CM
